# Patient Record
Sex: Male | Born: 1935 | Race: Black or African American | Hispanic: No | Smoking: Former smoker
Health system: Southern US, Community
[De-identification: ages and names within clinical notes are randomized; demographics above are authoritative.]

## PROBLEM LIST (undated history)

## (undated) DIAGNOSIS — I255 Ischemic cardiomyopathy: Secondary | ICD-10-CM

## (undated) DIAGNOSIS — I10 Essential (primary) hypertension: Secondary | ICD-10-CM

## (undated) DIAGNOSIS — I2510 Atherosclerotic heart disease of native coronary artery without angina pectoris: Secondary | ICD-10-CM

## (undated) DIAGNOSIS — E785 Hyperlipidemia, unspecified: Secondary | ICD-10-CM

## (undated) HISTORY — DX: Hyperlipidemia, unspecified: E78.5

---

## 1998-06-02 ENCOUNTER — Ambulatory Visit (HOSPITAL_COMMUNITY): Admission: RE | Admit: 1998-06-02 | Discharge: 1998-06-02 | Payer: Self-pay | Admitting: Gastroenterology

## 2003-07-15 ENCOUNTER — Ambulatory Visit (HOSPITAL_COMMUNITY): Admission: RE | Admit: 2003-07-15 | Discharge: 2003-07-15 | Payer: Self-pay | Admitting: Gastroenterology

## 2008-05-15 ENCOUNTER — Ambulatory Visit: Admission: RE | Admit: 2008-05-15 | Discharge: 2008-08-09 | Payer: Self-pay | Admitting: Radiation Oncology

## 2008-08-09 ENCOUNTER — Ambulatory Visit: Admission: RE | Admit: 2008-08-09 | Discharge: 2008-08-26 | Payer: Self-pay | Admitting: Radiation Oncology

## 2008-08-27 ENCOUNTER — Encounter: Admission: RE | Admit: 2008-08-27 | Discharge: 2008-08-27 | Payer: Self-pay | Admitting: Urology

## 2008-09-09 ENCOUNTER — Ambulatory Visit (HOSPITAL_BASED_OUTPATIENT_CLINIC_OR_DEPARTMENT_OTHER): Admission: RE | Admit: 2008-09-09 | Discharge: 2008-09-09 | Payer: Self-pay | Admitting: Urology

## 2008-09-25 ENCOUNTER — Ambulatory Visit: Admission: RE | Admit: 2008-09-25 | Discharge: 2008-10-21 | Payer: Self-pay | Admitting: Radiation Oncology

## 2011-03-16 NOTE — Op Note (Signed)
NAMECLEMENTS, TORO                ACCOUNT NO.:  [id number]   MEDICAL RECORD NO.:  [id number]          PATIENT TYPE:  AMB   LOCATION:  NESC                         FACILITY:  [facility]   PHYSICIAN:  Lindaann Slough, M.D.  DATE OF BIRTH:  1936-05-16   DATE OF PROCEDURE:  09/09/2008  DATE OF DISCHARGE:                               OPERATIVE REPORT   PREOPERATIVE DIAGNOSIS:  Adenocarcinoma of prostate stage T1C.   POSTOPERATIVE DIAGNOSIS:  Adenocarcinoma of prostate stage T1C.   PROCEDURES:  125 seed implantation and cystoscopy.   SURGEON:  Danae Chen, M.D.   ANESTHESIA:  General.   INDICATIONS:  The patient is a 75 year old male who has prostate cancer,  Gleason score of 3+4 at the left mid gland and 3+3 at the other biopsy  sites.  PSA at diagnosis was 4.4.  He elected to have radiation  treatment.  He was seen by Dr. Dayton Scrape, who advised him to have  combination external beam radiotherapy and brachytherapy.  He has  already received external beam radiotherapy.  He is scheduled today for  brachytherapy.   The patient was identified by his wrist band and proper time-out was  taken.  He was then placed in the dorsal lithotomy position.  Ultrasound  planning was done by Dr. Dayton Scrape and when the planning was completed, a  total of 26 needles were used to insert 74 seeds in the prostate.  The  total apparent activity is 25.4560 mCi.  Then the Foley catheter that  was previously inserted in the bladder was removed.  A flexible  cystoscope was then passed in the bladder.  The anterior urethra is  normal.  He has moderate prostatic hypertrophy.  There is no stone, seed  or tumor in the bladder.  The ureteral orifices are in normal position  and shape with clear efflux.  The cystoscope was then removed.  A #16  Foley catheter was then inserted in the bladder.   Please note that the procedure was done by myself and Dr. Chipper Herb.   The patient tolerated the procedure well and left the  OR in satisfactory  condition to post anesthesia care unit.      Lindaann Slough, M.D.  Electronically Signed     MN/MEDQ  D:  09/09/2008  T:  09/09/2008  Job:  [id number]   cc:   Maryln Gottron, M.D.  Fax: [phone]

## 2011-03-19 NOTE — Op Note (Signed)
   NAME:  Dillon Middleton, DONALD A                          ACCOUNT NO.:  [id number]   MEDICAL RECORD NO.:  [id number]                   PATIENT TYPE:  AMB   LOCATION:  ENDO                                 FACILITY:  [facility]   PHYSICIAN:  Danise Edge, M.D.                DATE OF BIRTH:  [birth year]   DATE OF PROCEDURE:  07/15/2003  DATE OF DISCHARGE:                                 OPERATIVE REPORT   PROCEDURE:  Screening colonoscopy.   REFERRED BY:  Al Decant. Janey Greaser, MD   INDICATIONS FOR PROCEDURE:  Klayton Monie is a 75 year old male born 19-Apr-1936. In 1990, he underwent a colonoscopy with removal of a colon polyp.  His 1999 screening colonoscopy was normal. He is scheduled to undergo his  third screening colonoscopy with polypectomy to prevent colon cancer.   ENDOSCOPIST:  Charolett Bumpers, M.D.   PREMEDICATION:  Versed 5 mg, Demerol 50 mg .   DESCRIPTION OF PROCEDURE:  After obtaining informed consent, Mr. Swander was  placed in the left lateral decubitus position. I administered intravenous  Demerol and intravenous Versed to achieve conscious sedation for the  procedure. The patient's blood pressure, oxygen saturation and cardiac  rhythm were monitored throughout the procedure and documented in the medical  record.   Anal inspection was normal. Digital rectal exam revealed a small nonnodular  prostate. The Olympus pediatric adjustable colonoscope was introduced into  the rectum and advanced to the cecum. A normal appearing ileocecal valve was  intubated and distal ileum inspected. Colonic preparation for the exam today  was excellent.   RECTUM:  Normal.   SIGMOID COLON AND DESCENDING COLON:  Normal.   SPLENIC FLEXURE:  Normal.   TRANSVERSE COLON:  Normal.   HEPATIC FLEXURE:  Normal.   ASCENDING COLON:  Normal.   CECUM AND ILEOCECAL VALVE:  Normal.   DISTAL ILEUM:  Normal.    ASSESSMENT:  Normal screening proctocolonoscopy to the cecum.   RECOMMENDATIONS:   Repeat colonoscopy in five years.                                               Danise Edge, M.D.    MJ/MEDQ  D:  07/15/2003  T:  07/15/2003  Job:  [id number]   cc:   Al Decant. Janey Greaser, MD  [address]  [place]  [place] [address]  Fax: [phone]

## 2011-06-10 ENCOUNTER — Ambulatory Visit: Payer: Self-pay | Admitting: Sports Medicine

## 2011-07-01 ENCOUNTER — Ambulatory Visit (INDEPENDENT_AMBULATORY_CARE_PROVIDER_SITE_OTHER): Payer: Medicare Other | Admitting: Sports Medicine

## 2011-07-01 ENCOUNTER — Encounter: Payer: Self-pay | Admitting: Sports Medicine

## 2011-07-01 VITALS — HR 80 | Ht 71.1 in | Wt 189.0 lb

## 2011-07-01 DIAGNOSIS — M1710 Unilateral primary osteoarthritis, unspecified knee: Secondary | ICD-10-CM

## 2011-07-01 DIAGNOSIS — M25561 Pain in right knee: Secondary | ICD-10-CM

## 2011-07-01 DIAGNOSIS — M1711 Unilateral primary osteoarthritis, right knee: Secondary | ICD-10-CM

## 2011-07-01 DIAGNOSIS — M25569 Pain in unspecified knee: Secondary | ICD-10-CM

## 2011-07-01 NOTE — Progress Notes (Signed)
  Subjective:    Patient ID: Dillon Middleton, male    DOB: 1936-04-22, 75 y.o.   MRN: [id number]  CC: Right knee pain  HPI  Right knee Pain and swelling noted off/on for past 2-3 years Worse in past 3 weeks after doing a workout with burpees with personal trainer Swelling noted after running long distance and playing racketball Crepitus noted with movement No redness, bruising, locking, or giving way  Enjoys cycling, who does not cause knee pain or swelling  Hx of right knee open surgery for osteochondral fragment removal in 1974  Psych counselor at [employer]  Review of Systems  No fever, chills, sweats, weight loss No nausea, vomiting, diarrhea No numbness, tingling No knee pain currently, mild posterior knee swelling, see HPI     Objective:   Physical Exam  No pain currently  Const: NAD, sitting on table Skin: no redness, swelling, bruising, no warmth Neuro: reflexes 2+ patellar and achilles, normal light sensation lower extremities  Musk/skel:  Right knee Good thigh and hamstring tone, 5/5 strength Ext  -3 deg Flex 130 deg Mild laxity ACL and MCL Scar along medial patella with thickened scar tissue Mild fullness in popliteal fossa  Left knee Good thigh and hamstring tone, 5/5 strength Ext  0 deg Flex 160 deg No laxity   Assessment & Plan:

## 2011-07-01 NOTE — Assessment & Plan Note (Signed)
OK to continue OTC aleve as needed for a few days w flares Icing p activity

## 2011-07-01 NOTE — Patient Instructions (Signed)
Aleve 2 tabs by mouth twice a day x 3 days then as needed for pain or swelling Glucosamine/chondroitin supplement daily, see bottle for dose Avoid burpees and deep knee bends Wear knee sleeve for activities with weight bearing and deep knee bends  Return as needed for knee pain or swelling

## 2011-07-01 NOTE — Assessment & Plan Note (Signed)
Consider trial of glucosamine chondroitin  Avoid deg of knee flex that would create injury  Compression to lessen swelling Prn return

## 2011-08-03 LAB — CBC: Hemoglobin: 14.9

## 2011-08-03 LAB — PROTIME-INR: Prothrombin Time: 13.1

## 2011-08-03 LAB — COMPREHENSIVE METABOLIC PANEL
ALT: 25
Alkaline Phosphatase: 68
Chloride: 106
GFR calc Af Amer: >60
GFR calc non Af Amer: >60

## 2012-02-05 ENCOUNTER — Encounter (HOSPITAL_COMMUNITY): Payer: Self-pay | Admitting: *Deleted

## 2012-02-05 ENCOUNTER — Other Ambulatory Visit: Payer: Self-pay

## 2012-02-05 ENCOUNTER — Encounter (HOSPITAL_COMMUNITY)
Admission: EM | Disposition: A | Discharge status: Home / Self Care | Payer: Self-pay | Source: Home / Self Care | Attending: Cardiovascular Disease

## 2012-02-05 ENCOUNTER — Inpatient Hospital Stay (HOSPITAL_COMMUNITY)
Admission: EM | Admit: 2012-02-05 | Discharge: 2012-02-07 | DRG: 247 | Disposition: A | Discharge status: Home / Self Care | Payer: Medicare Other | Attending: Cardiovascular Disease | Admitting: Cardiovascular Disease

## 2012-02-05 DIAGNOSIS — I2109 ST elevation (STEMI) myocardial infarction involving other coronary artery of anterior wall: Secondary | ICD-10-CM

## 2012-02-05 DIAGNOSIS — I2510 Atherosclerotic heart disease of native coronary artery without angina pectoris: Secondary | ICD-10-CM

## 2012-02-05 DIAGNOSIS — I2589 Other forms of chronic ischemic heart disease: Secondary | ICD-10-CM | POA: Diagnosis present

## 2012-02-05 DIAGNOSIS — R079 Chest pain, unspecified: Secondary | ICD-10-CM

## 2012-02-05 DIAGNOSIS — I2582 Chronic total occlusion of coronary artery: Secondary | ICD-10-CM | POA: Diagnosis present

## 2012-02-05 DIAGNOSIS — Z7982 Long term (current) use of aspirin: Secondary | ICD-10-CM

## 2012-02-05 DIAGNOSIS — I213 ST elevation (STEMI) myocardial infarction of unspecified site: Secondary | ICD-10-CM

## 2012-02-05 DIAGNOSIS — Z6826 Body mass index (BMI) 26.0-26.9, adult: Secondary | ICD-10-CM

## 2012-02-05 DIAGNOSIS — Z87891 Personal history of nicotine dependence: Secondary | ICD-10-CM

## 2012-02-05 DIAGNOSIS — I10 Essential (primary) hypertension: Secondary | ICD-10-CM | POA: Diagnosis present

## 2012-02-05 HISTORY — DX: Atherosclerotic heart disease of native coronary artery without angina pectoris: I25.10

## 2012-02-05 HISTORY — DX: Essential (primary) hypertension: I10

## 2012-02-05 HISTORY — PX: PERCUTANEOUS CORONARY STENT INTERVENTION (PCI-S): SHX5485

## 2012-02-05 HISTORY — DX: Ischemic cardiomyopathy: I25.5

## 2012-02-05 HISTORY — PX: LEFT HEART CATH: SHX5478

## 2012-02-05 LAB — CARDIAC PANEL(CRET KIN+CKTOT+MB+TROPI)
CK, MB: 2.1 ng/mL (ref 0.3–4.0)
CK, MB: 188.2 ng/mL (ref 0.3–4.0)
Total CK: 67 U/L (ref 7–232)
Total CK: 4115 U/L — ABNORMAL HIGH (ref 7–232)
Total CK: 3084 U/L — ABNORMAL HIGH (ref 7–232)
Troponin I: >25 ng/mL (ref <0.30)

## 2012-02-05 LAB — POCT I-STAT TROPONIN I: Troponin i, poc: 0.04 ng/mL (ref 0.00–0.08)

## 2012-02-05 LAB — BASIC METABOLIC PANEL
BUN: 13 mg/dL (ref 6–23)
CO2: 29 mEq/L (ref 19–32)
Calcium: 9.1 mg/dL (ref 8.4–10.5)
Creatinine, Ser: 1.08 mg/dL (ref 0.50–1.35)
Potassium: 3.9 mEq/L (ref 3.5–5.1)

## 2012-02-05 LAB — APTT: aPTT: 24 seconds (ref 24–37)

## 2012-02-05 LAB — DIFFERENTIAL
Basophils Absolute: 0 10*3/uL (ref 0.0–0.1)
Basophils Relative: 1 % (ref 0–1)
Eosinophils Relative: 1 % (ref 0–5)
Lymphs Abs: 0.7 10*3/uL (ref 0.7–4.0)
Monocytes Relative: 15 % — ABNORMAL HIGH (ref 3–12)
Neutro Abs: 2.9 10*3/uL (ref 1.7–7.7)
Neutrophils Relative %: 67 % (ref 43–77)

## 2012-02-05 LAB — PROTIME-INR: Prothrombin Time: 12.6 seconds (ref 11.6–15.2)

## 2012-02-05 LAB — CBC
RBC: 4.73 MIL/uL (ref 4.22–5.81)
RDW: 13 % (ref 11.5–15.5)
WBC: 4.3 10*3/uL (ref 4.0–10.5)

## 2012-02-05 LAB — MRSA PCR SCREENING: MRSA by PCR: NEGATIVE

## 2012-02-05 SURGERY — LEFT HEART CATH
Anesthesia: LOCAL | Site: Groin | Laterality: Right

## 2012-02-05 MED ORDER — HEPARIN (PORCINE) IN NACL 100-0.45 UNIT/ML-% IJ SOLN
INTRAMUSCULAR | Status: AC
  Filled 2012-02-05: qty 250

## 2012-02-05 MED ORDER — LISINOPRIL 5 MG PO TABS
5.0000 mg | ORAL_TABLET | Freq: Every day | ORAL | Status: DC
  Administered 2012-02-05: 5 mg via ORAL
  Filled 2012-02-05 (×2): qty 1

## 2012-02-05 MED ORDER — ASPIRIN 81 MG PO CHEW: 324.0000 mg | CHEWABLE_TABLET | ORAL | Status: AC

## 2012-02-05 MED ORDER — OXYCODONE-ACETAMINOPHEN 5-325 MG PO TABS: 1.0000 | ORAL_TABLET | ORAL | Status: DC | PRN

## 2012-02-05 MED ORDER — ASPIRIN 81 MG PO CHEW
81.0000 mg | CHEWABLE_TABLET | Freq: Every day | ORAL | Status: DC
  Administered 2012-02-06: 81 mg via ORAL

## 2012-02-05 MED ORDER — NITROGLYCERIN 0.4 MG SL SUBL
0.4000 mg | SUBLINGUAL_TABLET | SUBLINGUAL | Status: DC | PRN
  Filled 2012-02-05: qty 25

## 2012-02-05 MED ORDER — ASPIRIN 300 MG RE SUPP: 300.0000 mg | RECTAL | Status: AC

## 2012-02-05 MED ORDER — SODIUM CHLORIDE 0.9 % IV SOLN
INTRAVENOUS | Status: AC
  Administered 2012-02-05: 10:00:00 via INTRAVENOUS

## 2012-02-05 MED ORDER — HEPARIN BOLUS VIA INFUSION
4000.0000 [IU] | Freq: Once | INTRAVENOUS | Status: AC
  Administered 2012-02-05: 4000 [IU] via INTRAVENOUS

## 2012-02-05 MED ORDER — ATORVASTATIN CALCIUM 80 MG PO TABS
80.0000 mg | ORAL_TABLET | Freq: Every day | ORAL | Status: DC
  Administered 2012-02-05 – 2012-02-07 (×3): 80 mg via ORAL
  Filled 2012-02-05 (×3): qty 1

## 2012-02-05 MED ORDER — MIDAZOLAM HCL 2 MG/2ML IJ SOLN
INTRAMUSCULAR | Status: AC
  Filled 2012-02-05: qty 2

## 2012-02-05 MED ORDER — ACETAMINOPHEN 325 MG PO TABS: 650.0000 mg | ORAL_TABLET | ORAL | Status: DC | PRN

## 2012-02-05 MED ORDER — ASPIRIN EC 81 MG PO TBEC
81.0000 mg | DELAYED_RELEASE_TABLET | Freq: Every day | ORAL | Status: DC
  Administered 2012-02-06 – 2012-02-07 (×2): 81 mg via ORAL
  Filled 2012-02-05 (×2): qty 1

## 2012-02-05 MED ORDER — ACETAMINOPHEN 325 MG PO TABS
650.0000 mg | ORAL_TABLET | ORAL | Status: DC | PRN
  Filled 2012-02-05: qty 2

## 2012-02-05 MED ORDER — METOPROLOL TARTRATE 25 MG PO TABS: 25.0000 mg | ORAL_TABLET | Freq: Two times a day (BID) | ORAL | Status: DC

## 2012-02-05 MED ORDER — SODIUM CHLORIDE 0.9 % IV SOLN: 0.2500 mg/kg/h | INTRAVENOUS | Status: DC

## 2012-02-05 MED ORDER — LIDOCAINE HCL (PF) 1 % IJ SOLN
INTRAMUSCULAR | Status: AC
  Filled 2012-02-05: qty 30

## 2012-02-05 MED ORDER — ASPIRIN 81 MG PO CHEW
324.0000 mg | CHEWABLE_TABLET | Freq: Once | ORAL | Status: AC
  Administered 2012-02-05: 324 mg via ORAL
  Filled 2012-02-05: qty 4

## 2012-02-05 MED ORDER — FENTANYL CITRATE 0.05 MG/ML IJ SOLN
INTRAMUSCULAR | Status: AC
  Filled 2012-02-05: qty 2

## 2012-02-05 MED ORDER — HEPARIN (PORCINE) IN NACL 2-0.9 UNIT/ML-% IJ SOLN
INTRAMUSCULAR | Status: AC
  Filled 2012-02-05: qty 2000

## 2012-02-05 MED ORDER — TICAGRELOR 90 MG PO TABS
ORAL_TABLET | ORAL | Status: AC
  Administered 2012-02-05: 90 mg via ORAL
  Filled 2012-02-05: qty 2

## 2012-02-05 MED ORDER — HEPARIN SODIUM (PORCINE) 1000 UNIT/ML IJ SOLN
INTRAMUSCULAR | Status: AC
  Filled 2012-02-05: qty 1

## 2012-02-05 MED ORDER — BIVALIRUDIN 250 MG IV SOLR
INTRAVENOUS | Status: AC
  Filled 2012-02-05: qty 250

## 2012-02-05 MED ORDER — ONDANSETRON HCL 4 MG/2ML IJ SOLN: 4.0000 mg | Freq: Four times a day (QID) | INTRAMUSCULAR | Status: DC | PRN

## 2012-02-05 MED ORDER — VERAPAMIL HCL 2.5 MG/ML IV SOLN
INTRAVENOUS | Status: AC
  Filled 2012-02-05: qty 2

## 2012-02-05 MED ORDER — CARVEDILOL 6.25 MG PO TABS
6.2500 mg | ORAL_TABLET | Freq: Two times a day (BID) | ORAL | Status: DC
  Administered 2012-02-05 (×2): 6.25 mg via ORAL
  Filled 2012-02-05 (×5): qty 1

## 2012-02-05 MED ORDER — TICAGRELOR 90 MG PO TABS
90.0000 mg | ORAL_TABLET | Freq: Two times a day (BID) | ORAL | Status: DC
  Administered 2012-02-05 – 2012-02-07 (×4): 90 mg via ORAL
  Filled 2012-02-05 (×5): qty 1

## 2012-02-05 NOTE — CV Procedure (Addendum)
    Cardiac Catheterization Operative Report  HULEN MANDLER [id number] 4/6/20134:51 AM No primary provider on file.  Procedure Performed:  1. Left Heart Catheterization 2. Selective Coronary Angiography 3. Left ventricular angiogram 4. PTCA/DES x 1 mid LAD  Operator: Verne Carrow, MD  Arterial access site:  Right radial artery.   Indication:  Anterior STEMI                                     Procedure Details: Pt transported emergently from [facility] ED. Emergency consent obtained. The risks, benefits, complications, treatment options, and expected outcomes were discussed with the patient. The patient agreed to proceed. The patient was further sedated with Versed. The right wrist was assessed with an Allens test which was positive. The right wrist was prepped and draped in a sterile fashion. 1% lidocaine was used for local anesthesia. Using the modified Seldinger access technique, a 6 French sheath was placed in the right radial artery. 3 mg Verapamil was given through the sheath. An Angiomax drip was started after a bolus was given.  Standard diagnostic catheters were used to perform selective coronary angiography. A XB LAD 3.5 guiding catheter was used to engage the left main artery. When the ACT was greater than 200, I passed a Cougar IC wire down the  LAD. There was reperfusion with the wire crossing the occlusion. A 2.5 x 15 mm balloon was used to pre-dilate the stenosis. A 2.75 x 22 Resolute Integrity DES was deployed in the mid LAD. A 3.0 x 20 mm [place] balloon was used to post-dilate the stent. There was an excellent angiographic result. The stenosis was taken from 100% to 0%. There was excellent flow down the vessel. The guiding catheter and wire were removed. A pigtail catheter was used to perform a left ventricular angiogram. The sheath was removed from the right radial artery and a hemostasis band was applied at the arteriotomy site on the right wrist.   There were no  immediate complications. The patient was taken to the recovery area in stable condition.   Pt arrived in ED at [facility] and first EKG at 3:40am. Code STEMI called at 4:24am. Pt arrived at [facility] in the cath lab at 4:59am. First balloon at 5:23am. Reperfusion at 5:23 am.   Hemodynamic Findings: Central aortic pressure: 121/72 Left ventricular pressure: 119/9/20  Angiographic Findings:  Left main: No obstructive disease noted.   Left Anterior Descending Artery:  Mild plaque proximal vessel. 100% mid occlusion. The diagonal is moderate sized and and has a 40% proximal stenosis.   Circumflex Artery: Large vessel with large OM1 and moderated sized posterolateral branch. No obstructive disease noted.   Right Coronary Artery: Large dominant vessel with no obstructive disease noted.   Left Ventricular Angiogram: LVEF 40-45%. Hypokinesis of the anterior wall and apex.    Impression: 1.  Anterior STEMI secondary to occluded mid LAD 2.  Successful PTCA/DES x 1 mid LAD 3.  Segmental LV dysfunction  Recommendations: He will need ASA/Brilinta for one year. We will start beta blocker and statin. We will add an Ace-inhibitor if his BP tolerates.        Complications:  None. The patient tolerated the procedure well.

## 2012-02-05 NOTE — ED Provider Notes (Signed)
History     CSN: [id number]  Arrival date & time 02/05/12  9629   First MD Initiated Contact with Patient 02/05/12 [phone]      Chief Complaint  Patient presents with  . Chest Pain    (Consider location/radiation/quality/duration/timing/severity/associated sxs/prior treatment) HPI Comments: Patient developed chest pain while riding his bike about 2 weeks ago. Since that time he has had several episodes of substernal chest pain with exercise. Tonight he woke up from sleep and started having heaviness in his chest that didn't resolve quickly. Will on arrival to the ER the chest pain resolved and on exam in the room patient is not currently having chest pain. The pain does not radiate and is always dull in nature.  Patient is a 76 y.o. male presenting with chest pain. The history is provided by the patient.  Chest Pain The chest pain began 1 - 2 weeks ago. Chest pain occurs intermittently. The chest pain is resolved. The pain is associated with exertion. At its most intense, the pain is at 6/10. The pain is currently at 0/10. The severity of the pain is moderate. The quality of the pain is described as aching, dull and pressure-like. The pain does not radiate. Chest pain is worsened by exertion. Primary symptoms include shortness of breath. Pertinent negatives for primary symptoms include no cough, no wheezing, no nausea, no vomiting and no dizziness.  Pertinent negatives for associated symptoms include no diaphoresis and no lower extremity edema. He tried nothing for the symptoms. Risk factors include male gender and being elderly.  His past medical history is significant for hypertension.  Pertinent negatives for past medical history include no CAD, no diabetes and no hyperlipidemia.  Procedure history is negative for cardiac catheterization.     Past Medical History  Diagnosis Date  . Hypertension     History reviewed. No pertinent past surgical history.  History reviewed. No pertinent  family history.  History  Substance Use Topics  . Smoking status: Former Games developer  . Smokeless tobacco: Never Used  . Alcohol Use: No      Review of Systems  Constitutional: Negative for diaphoresis.  Respiratory: Positive for shortness of breath. Negative for cough and wheezing.   Cardiovascular: Positive for chest pain.  Gastrointestinal: Negative for nausea and vomiting.  Neurological: Negative for dizziness.  All other systems reviewed and are negative.    Allergies  Review of patient's allergies indicates no known allergies.  Home Medications   Current Outpatient Rx  Name Route Sig Dispense Refill  . AMLODIPINE BESYLATE 5 MG PO TABS Oral Take 5 mg by mouth daily.    Marland Kitchen LOSARTAN POTASSIUM 100 MG PO TABS Oral Take 100 mg by mouth daily.      BP 151/97  Pulse 70  Temp(Src) 97.8 F (36.6 C) (Oral)  Resp 20  SpO2 100%  Physical Exam  Nursing note and vitals reviewed. Constitutional: He is oriented to person, place, and time. He appears well-developed and well-nourished. No distress.  HENT:  Head: Normocephalic and atraumatic.  Mouth/Throat: Oropharynx is clear and moist.  Eyes: Conjunctivae and EOM are normal. Pupils are equal, round, and reactive to light.  Neck: Normal range of motion. Neck supple.  Cardiovascular: Normal rate, regular rhythm and intact distal pulses.   No murmur heard. Pulmonary/Chest: Effort normal and breath sounds normal. No respiratory distress. He has no wheezes. He has no rales.  Abdominal: Soft. He exhibits no distension. There is no tenderness. There is no rebound and no  guarding.  Musculoskeletal: Normal range of motion. He exhibits no edema and no tenderness.  Neurological: He is alert and oriented to person, place, and time.  Skin: Skin is warm and dry. No rash noted. No erythema.  Psychiatric: He has a normal mood and affect. His behavior is normal.    ED Course  Procedures (including critical care time)   Labs Reviewed  BASIC  METABOLIC PANEL  CBC  DIFFERENTIAL  PROTIME-INR  APTT  CARDIAC PANEL(CRET KIN+CKTOT+MB+TROPI)   No results found.   Date: 02/06/2012  Rate: 70  Rhythm: normal sinus rhythm  QRS Axis: normal  Intervals: normal  ST/T Wave abnormalities: nonspecific ST changes, 1mm elevation of ST segment in V3  Conduction Disutrbances:right bundle branch block  Narrative Interpretation:   Old EKG Reviewed: none available   Date: 02/06/2012  Rate: 65  Rhythm: normal sinus rhythm  QRS Axis: normal  Intervals: normal  ST/T Wave abnormalities: ST elevations anteriorly  more prominent 1 mm ST elevation in V3 and V4. No reciprocal changes noted.  Conduction Disutrbances:right bundle branch block  Narrative Interpretation:   Old EKG Reviewed: changes noted, patient with more suspicious ST elevation in V3 and V4 from prior EKG.      1. STEMI (ST elevation myocardial infarction)       MDM   Initial EKG done at 340 and showed 1 concerning lead in V3 with 1 mm of ST elevation. EKG repeated at 406 and now more concerning for possible acute MI however not all the criteria was met for STEMI. Interventional cardiologist contacted Dr. Vernetta Honey and he reviewed the EKG and agreed to call a code STEMI.  This was discussed with the patient. [facility] came and transported the patient to [facility].        Gwyneth Sprout, MD 02/06/12 [phone]

## 2012-02-05 NOTE — H&P (Signed)
Dillon Middleton, Dillon Middleton                ACCOUNT NO.:  [id number]  MEDICAL RECORD NO.:  [id number]  LOCATION:  2907                         FACILITY:  [facility]  PHYSICIAN:  Natasha Bence, MD       DATE OF BIRTH:  Jul 25, 1936  DATE OF ADMISSION:  02/05/2012 DATE OF DISCHARGE:                             HISTORY & PHYSICAL   CHIEF COMPLAINT:  Chest pain.  HISTORY OF PRESENT ILLNESS:  Dillon Middleton is a 76 year old, black male, who presents to the emergency department with substernal chest heaviness as he describes it.  He reports this pain abruptly woke him up at 1:30 a.m. He was also mildly short of breath and diaphoretic.  He has not had this type of chest discomfort before.  He is an avid bike rider and rides approximately 20 mile a day.  He does this without any angina or shortness of breath.  Otherwise, he has no history of palpitations, PND, or orthopnea.  He has no planned surgeries.  He denies any history of bleeding or melena.  Otherwise, no fevers, chills, or sweats.  REVIEW OF SYSTEMS:  A 12-point review of system was otherwise negative.  PAST MEDICAL HISTORY:  Hypertension.  FAMILY HISTORY:  No known coronary artery disease.  SOCIAL HISTORY:  He is a former smoker, quit over 50 years ago, just very short history of smoking.  No alcohol or illicit drug use.  ALLERGIES:  He has no known allergies.  MEDICATIONS:  He takes amlodipine 5 mg p.o. daily and losartan 100 mg p.o. daily.  PHYSICAL EXAMINATION:  VITAL SIGNS:  He is afebrile with a temperature of 97.8, pulse of 70 and regular, blood pressure of 151/97, O2 sats 100%, respiratory rate 20. GENERAL:  He is a well-developed, well-nourished black male in no apparent distress.  HEENT:  Eyes, anicteric sclerae.  Mucous membranes are moist. NECK:  He has normal jugular venous pressure.  No carotid bruits. LUNGS:  Clear to auscultation bilaterally. CARDIOVASCULAR:  Regular rate and rhythm.  No murmurs, rubs, or gallops. ABDOMEN:   Soft, nontender, nondistended. EXTREMITIES:  Warm with no edema.  He has symmetrical pulses throughout. NEURO:  Grossly afocal.  LABORATORY DATA:  Sodium 140, potassium 3.9, chloride of 105, bicarb of 29, BUN of 13, creatinine of 1.08, glucose of 112.  White blood cell count of 4.3, hematocrit of 41, platelet count of 133.  Troponin of 0.04.  EKG shows anterior ST elevation.  IMPRESSION AND PLAN:  This is a 76 year old, black male, with hypertension presents with anterior ST-elevation myocardial infarction. He was taken promptly to the catheterization lab where he was found to have an occluded mid left anterior descending.  This was successfully stented with a drug-eluting stent.  He is currently pain free and hemodynamically stable.  We will admit him to the ICU on telemetry, obtain an echocardiogram in the morning.  Otherwise, we will place him on dual anti-platelet therapy with aspirin and Brilinta.  He will also be on high-dose statin and beta blocker.          ______________________________ Natasha Bence, MD     MH/MEDQ  D:  02/05/2012  T:  02/05/2012  Job:  [id number] 

## 2012-02-05 NOTE — Progress Notes (Signed)
CRITICAL VALUE ALERT  Critical value received:  Ckmb 186.2, trop>25.0  Date of notification:  02/05/2012   Time of notification:  1:45 PM   Critical value read back:yes  Nurse who received alert:  Desiree Lucy    MD notified (1st page):  Barrett pa 1:46 PM   Time of first page:  1:55 PM   MD notified (2nd page):  Time of second page:  Responding MD:     Time MD responded:

## 2012-02-05 NOTE — Progress Notes (Addendum)
CARDIAC REHAB PHASE I   PRE:  Rate/Rhythm: 68 SR w/ PVCs  BP:  Supine: 144/90 Sitting:   Standing:    SaO2: 99% RA  MODE:  Ambulation: 700 ft   POST:  Rate/Rhythem: 73 SR  BP:  Supine: 149/100 rck BP 144/96   Sitting:   Standing:    SaO2: 100% RA  1350-1445 Pt tolerated ambulation very well without c/o. Gait steady. BP was elevated before and after ambulation, informed pt's RN about elevated BP. Reviewed MI/PCI/stent education with patient and pt's family including, restrictions, risk factors, CP, NTG use, & calling 911, heart healthy diet and exercise guidelines given. Pt follows a pretty healthy diet and is a regular exerciser. Pt voices understanding of instructions given. Reviewed Phase 2 Cardiac Rehab program with pt and permission given to send his contact info to CR program at [facility]  Annetta Maw

## 2012-02-05 NOTE — Progress Notes (Signed)
Chaplain's Note:  Chaplain Lumpkin asked me to follow-up with pt this morning.  I visited with the patient and offered emotional and spiritual support.  Pt was very grateful for Chaplain Lumpkin's support last night and mine this morning.   Please page if further assistance is needed. Dellie Catholic  [phone]  Oncall pager  02/05/12 [phone]  Clinical Encounter Type  Visited With Patient  Visit Type Follow-up  Referral From Chaplain  Spiritual Encounters  Spiritual Needs Emotional  Stress Factors  Patient Stress Factors Major life changes

## 2012-02-05 NOTE — Progress Notes (Addendum)
Spiritual Care and Wholeness:  Dillon Middleton came to [facility] from [facility]. He had driven himself first to an [facility], then to [facility] with chest pains. His automobile is in the parking lot at [facility] and at release he will need a shuttle to [facility] to retrieve it.  No family or friends came to be with the pt, a niece may come later. His "girlfriend" is in [place] and may come during the weekend. His mother, who is over 3 years of age is in a local elder care facility. Her mental state is such that she likely would not understand if she was told of her son's hospitalization so no attempt will be made to alert her at the Pt request. I had time after the procedure to speak with the pt and based on this conversation I found  Dillon Middleton is at peace with what has been done for him. He has a positive attitude and seems ready to follow physician's instructions for preventative and post procedure care.  Dillon Middleton chief concern is "why me" because he thought he was doing everything "right" to keep good health. He is not a smoker, keeps his weight down to avoid diabetes and eats right. He felt like his mother he would live healthy long after his 89th birthday.   At the time of this note no room assignment has been completed. I will pass on to incoming chaplains to follow up during daylight hours after the pt has had time to rest.   Dillon Middleton, APC, D.Min Chaplain 6:40 AM  02/05/2012

## 2012-02-05 NOTE — ED Notes (Signed)
PT states chest heaviness w/ exertion starting x 2 weeks ago which recurred w/ exercise since then. Pt states tonight when he woke from sleep to use the bathroom, he started having this sensation of heaviness again and it has not resolved. Pt states nosebleed x 3 days ago which resolved.

## 2012-02-05 NOTE — Progress Notes (Signed)
Report from Night RN. Chart reviewed together. Handoff complete.  

## 2012-02-06 DIAGNOSIS — I219 Acute myocardial infarction, unspecified: Secondary | ICD-10-CM

## 2012-02-06 DIAGNOSIS — I517 Cardiomegaly: Secondary | ICD-10-CM

## 2012-02-06 LAB — CBC
HCT: 40.2 % (ref 39.0–52.0)
Hemoglobin: 14 g/dL (ref 13.0–17.0)
MCV: 85.4 fL (ref 78.0–100.0)
WBC: 5.2 10*3/uL (ref 4.0–10.5)

## 2012-02-06 LAB — BASIC METABOLIC PANEL
BUN: 8 mg/dL (ref 6–23)
CO2: 27 mEq/L (ref 19–32)
Chloride: 104 mEq/L (ref 96–112)
Creatinine, Ser: 1.01 mg/dL (ref 0.50–1.35)
Glucose, Bld: 115 mg/dL — ABNORMAL HIGH (ref 70–99)
Potassium: 3.8 mEq/L (ref 3.5–5.1)

## 2012-02-06 MED ORDER — LISINOPRIL 5 MG PO TABS: 5.0000 mg | ORAL_TABLET | Freq: Two times a day (BID) | ORAL | Status: DC

## 2012-02-06 MED ORDER — LOSARTAN POTASSIUM 50 MG PO TABS
50.0000 mg | ORAL_TABLET | Freq: Two times a day (BID) | ORAL | Status: DC
  Administered 2012-02-06 – 2012-02-07 (×2): 50 mg via ORAL
  Filled 2012-02-06 (×4): qty 1

## 2012-02-06 MED ORDER — CARVEDILOL 6.25 MG PO TABS
9.3750 mg | ORAL_TABLET | Freq: Two times a day (BID) | ORAL | Status: DC
  Administered 2012-02-06: 9.375 mg via ORAL
  Filled 2012-02-06 (×6): qty 1

## 2012-02-06 NOTE — Progress Notes (Signed)
  Echocardiogram 2D Echocardiogram has been performed.  Kiran Lapine, Real Cons 02/06/2012, 3:21 PM

## 2012-02-06 NOTE — Progress Notes (Signed)
Patient ID: Dillon Middleton, male   DOB: 1936-02-21, 76 y.o.   MRN: [id number]    SUBJECTIVE: No chest pain or dyspnea.  Feels well.     . aspirin  324 mg Oral NOW   Or  . aspirin  300 mg Rectal NOW  . aspirin  81 mg Oral Daily  . aspirin EC  81 mg Oral Daily  . atorvastatin  80 mg Oral q1800  . carvedilol  9.375 mg Oral BID WC  . heparin      . losartan  50 mg Oral BID  . Ticagrelor  90 mg Oral BID  . DISCONTD: carvedilol  6.25 mg Oral BID WC  . DISCONTD: lisinopril  5 mg Oral Daily  . DISCONTD: lisinopril  5 mg Oral BID  . DISCONTD: metoprolol tartrate  25 mg Oral BID      Filed Vitals:   02/05/12 2100 02/05/12 2300 02/06/12 0345 02/06/12 0736  BP: 141/95 127/83 126/88   Pulse:      Temp:  98.6 F (37 C) 98 F (36.7 C) 98.6 F (37 C)  TempSrc:  Oral Oral Oral  Resp:  16 16 16   Height:      Weight:      SpO2:  99% 99% 97%    Intake/Output Summary (Last 24 hours) at 02/06/12 0800 Last data filed at 02/06/12 0600  Gross per 24 hour  Intake    120 ml  Output   1350 ml  Net  -1230 ml    LABS: Basic Metabolic Panel:  Basename 02/06/12 0502 02/05/12 0430  NA 139 140  K 3.8 3.9  CL 104 105  CO2 27 29  GLUCOSE 115* 112*  BUN 8 13  CREATININE 1.01 1.08  CALCIUM 9.1 9.1  MG -- --  PHOS -- --   Liver Function Tests: No results found for this basename: AST:2,ALT:2,ALKPHOS:2,BILITOT:2,PROT:2,ALBUMIN:2 in the last 72 hours No results found for this basename: LIPASE:2,AMYLASE:2 in the last 72 hours CBC:  Basename 02/06/12 0502 02/05/12 0430  WBC 5.2 4.3  NEUTROABS -- 2.9  HGB 14.0 14.0  HCT 40.2 40.6  MCV 85.4 85.8  PLT 136* 133*   Cardiac Enzymes:  Basename 02/05/12 1747 02/05/12 1200 02/05/12 0430  CKTOTAL 3084* 4115* 67  CKMB 115.7* 188.2* 2.1  CKMBINDEX -- -- --  TROPONINI >25.00* >25.00* <0.30    RADIOLOGY: No results found.  PHYSICAL EXAM General: NAD Neck: No JVD, no thyromegaly or thyroid nodule.  Lungs: Clear to auscultation  bilaterally with normal respiratory effort. CV: Nondisplaced PMI.  Heart regular S1/S2, no S3/S4, no murmur.  No peripheral edema.  No carotid bruit.  Normal pedal pulses.  Abdomen: Soft, nontender, no hepatosplenomegaly, no distention.  Neurologic: Alert and oriented x 3.  Psych: Normal affect. Extremities: No clubbing or cyanosis.   TELEMETRY: Reviewed telemetry pt in NSR  ECG: Anterior MI, RBBB, LAFB  ASSESSMENT AND PLAN:  76 yo with history of HTN presented with anterior STEMI, now s/p PCI to LAD.  1. CAD: Anterior STEMI with DES to LAD.  Continue ASA 81, ticagrelor, high dose statin.  2. Ischemic cardiomyopathy: ECG suggestive of extensive anterior MI.  Euvolemic on exam.  EF 40-45% on LV-gram.   - Echo today: If EF < 40%, would start spironolactone and also strongly consider Lifevest.  - Increase Coreg to 9.375 mg bid. - Has had cough with lisinopril, will stop and start losartan 50 mg bid.  3. Transfer to telemetry.   Norvell Ureste [employer]  02/06/2012 8:04 AM

## 2012-02-07 ENCOUNTER — Other Ambulatory Visit: Payer: Self-pay

## 2012-02-07 ENCOUNTER — Encounter (HOSPITAL_COMMUNITY): Payer: Self-pay | Admitting: Physician Assistant

## 2012-02-07 DIAGNOSIS — I255 Ischemic cardiomyopathy: Secondary | ICD-10-CM | POA: Insufficient documentation

## 2012-02-07 DIAGNOSIS — I2510 Atherosclerotic heart disease of native coronary artery without angina pectoris: Secondary | ICD-10-CM | POA: Insufficient documentation

## 2012-02-07 LAB — BASIC METABOLIC PANEL
BUN: 16 mg/dL (ref 6–23)
CO2: 26 mEq/L (ref 19–32)
Chloride: 103 mEq/L (ref 96–112)
GFR calc non Af Amer: 66 mL/min — ABNORMAL LOW (ref >90)
Glucose, Bld: 102 mg/dL — ABNORMAL HIGH (ref 70–99)
Potassium: 3.9 mEq/L (ref 3.5–5.1)
Sodium: 138 mEq/L (ref 135–145)

## 2012-02-07 LAB — LIPID PANEL: Cholesterol: 155 mg/dL (ref 0–200)

## 2012-02-07 MED ORDER — TICAGRELOR 90 MG PO TABS: 90.0000 mg | ORAL_TABLET | Freq: Two times a day (BID) | ORAL | Status: DC

## 2012-02-07 MED ORDER — LOSARTAN POTASSIUM 50 MG PO TABS: 50.0000 mg | ORAL_TABLET | Freq: Two times a day (BID) | ORAL | Status: DC

## 2012-02-07 MED ORDER — NITROGLYCERIN 0.4 MG SL SUBL: 0.4000 mg | SUBLINGUAL_TABLET | SUBLINGUAL | Status: DC | PRN

## 2012-02-07 MED ORDER — CARVEDILOL 3.125 MG PO TABS
3.1250 mg | ORAL_TABLET | Freq: Two times a day (BID) | ORAL | Status: DC
  Administered 2012-02-07: 3.125 mg via ORAL
  Filled 2012-02-07 (×2): qty 1

## 2012-02-07 MED ORDER — ATORVASTATIN CALCIUM 80 MG PO TABS: 80.0000 mg | ORAL_TABLET | Freq: Every day | ORAL | Status: DC

## 2012-02-07 MED ORDER — ASPIRIN 81 MG PO TABS: 81.0000 mg | ORAL_TABLET | Freq: Every day | ORAL | Status: AC

## 2012-02-07 MED ORDER — CARVEDILOL 3.125 MG PO TABS
3.1250 mg | ORAL_TABLET | Freq: Once | ORAL | Status: AC
  Administered 2012-02-07: 3.125 mg via ORAL

## 2012-02-07 MED ORDER — CARVEDILOL 3.125 MG PO TABS: 3.1250 mg | ORAL_TABLET | Freq: Two times a day (BID) | ORAL | Status: DC

## 2012-02-07 MED FILL — Dextrose Inj 5%: INTRAVENOUS | Qty: 50 | Status: AC

## 2012-02-07 NOTE — Progress Notes (Signed)
SUBJECTIVE: NO chest pain or SOB. No events.   BP 97/68  Pulse 62  Temp(Src) 98.3 F (36.8 C) (Oral)  Resp 16  Ht 5\' 11"  (1.803 m)  Wt 189 lb 9.5 oz (86 kg)  BMI 26.44 kg/m2  SpO2 98%  Intake/Output Summary (Last 24 hours) at 02/07/12 0648 Last data filed at 02/06/12 1000  Gross per 24 hour  Intake    240 ml  Output    400 ml  Net   -160 ml    PHYSICAL EXAM General: Well developed, well nourished, in no acute distress. Alert and oriented x 3.  Psych:  Good affect, responds appropriately Neck: No JVD. No masses noted.  Lungs: Clear bilaterally with no wheezes or rhonci noted.  Heart: RRR with no murmurs noted. Abdomen: Bowel sounds are present. Soft, non-tender.  Extremities: No lower extremity edema.   LABS: Basic Metabolic Panel:  Basename 02/07/12 0033 02/06/12 0502  NA 138 139  K 3.9 3.8  CL 103 104  CO2 26 27  GLUCOSE 102* 115*  BUN 16 8  CREATININE 1.08 1.01  CALCIUM 9.0 9.1  MG -- --  PHOS -- --   CBC:  Basename 02/06/12 0502 02/05/12 0430  WBC 5.2 4.3  NEUTROABS -- 2.9  HGB 14.0 14.0  HCT 40.2 40.6  MCV 85.4 85.8  PLT 136* 133*   Cardiac Enzymes:  Basename 02/05/12 1747 02/05/12 1200 02/05/12 0430  CKTOTAL 3084* 4115* 67  CKMB 115.7* 188.2* 2.1  CKMBINDEX -- -- --  TROPONINI >25.00* >25.00* <0.30   Fasting Lipid Panel:  Basename 02/07/12 0033  CHOL 155  HDL 66  LDLCALC 77  TRIG 61  CHOLHDL 2.3  LDLDIRECT --    Current Meds:    . aspirin  81 mg Oral Daily  . aspirin EC  81 mg Oral Daily  . atorvastatin  80 mg Oral q1800  . carvedilol  9.375 mg Oral BID WC  . losartan  50 mg Oral BID  . Ticagrelor  90 mg Oral BID  . DISCONTD: carvedilol  6.25 mg Oral BID WC  . DISCONTD: lisinopril  5 mg Oral Daily  . DISCONTD: lisinopril  5 mg Oral BID     Echo 02/06/12: Left ventricle: The cavity size was normal. Wall thickness was increased in a pattern of moderate LVH. Systolic function was moderately to severely reduced. The  estimated ejection fraction was in the range of 30% to 35%. Mid to apical anteroseptal akinesis, apical anterior/apical lateral/apical inferior akinesis, akinesis of the true apex. No LV apical thrombus. Doppler parameters are consistent with abnormal left ventricular relaxation (grade 1 diastolic dysfunction). - Aortic valve: There was no stenosis. - Mitral valve: No significant regurgitation. - Left atrium: The atrium was mildly dilated. - Right ventricle: The cavity size was normal. Systolic function was normal. - Right atrium: The atrium was mildly dilated. - Pulmonary arteries: No complete TR doppler jet so unable to estimate PA systolic pressure. - Inferior vena cava: The vessel was normal in size; the respirophasic diameter changes were in the normal range (= 50%); findings are consistent with normal central venous pressure.  Assessment and Plan: 76 yo with history of HTN presented with anterior STEMI, now s/p PCI to LAD. Echo with LVEF of 35%.   1. CAD: Anterior STEMI with DES to LAD. Continue ASA 81, Brilinta, high dose statin, beta blocker. He is now on an ARB. Will lower Coreg to 3.125 mg po BID with bradycardia and hypotension.  2. Ischemic cardiomyopathy: ECG suggestive of extensive anterior MI. Euvolemic on exam. EF 30-35% on echo. He is agreeable to have a Lifevest at discharge given his high risk of sudden cardiac death.  Continue Coreg and Losartan. He will need repeat echo in 6 weeks and if LVEF is still less than 35% , he will need an ICD.   3. Dispo: D/C home today. F/U with me in 2 weeks. No cycling for next week. No work for next week.     Dillon Middleton  4/8/20136:48 AM

## 2012-02-07 NOTE — Discharge Instructions (Signed)
Myocardial Infarction A myocardial infarction (MI) is damage to the heart that is not reversible. It is also called a heart attack. An MI usually occurs when a heart (coronary) artery becomes blocked or narrowed. This cuts off the blood supply to the heart. When one or more of the heart (coronary) arteries becomes blocked, that area of the heart begins to die. This causes pain felt during an MI.  If you think you might be having an MI, call your local emergency services immediately (911 in [place] It is recommended that you take a 162 mg non-enteric coated aspirin if you do not have an aspirin allergy. Do not drive yourself to the hospital or wait to see if your symptoms go away. The sooner MI is treated, the greater the amount of heart muscle saved. Time is muscle. It can save your life. CAUSES  An MI can occur from:  A gradual buildup of a fatty substance called plaque. When plaque builds up in the arteries, this condition is called atherosclerosis. This buildup can block or reduce the blood supply to the heart artery(s).   A sudden plaque rupture within a heart artery that causes a blood clot (thrombus). A blood clot can block the heart artery which does not allow blood flow to the heart.   A severe tightening (spasm) of the heart artery. This is a less common cause of a heart attack. When a heart artery spasms, it cuts off blood flow through the artery. Spasms can occur in heart arteries that do not have atherosclerosis.  RISK FACTORS People at risk for an MI usually have one or more risk factors, such as:  High blood pressure.   High cholesterol.   Smoking.   Gender. Men have a higher heart attack risk.   Overweight/obesity.   Age.   Family history.   Lack of exercise.   Diabetes.   Stress.   Excessive alcohol use.   Street drug use (cocaine and methamphetamines).  SYMPTOMS  MI symptoms can vary, such as:  In both men and women, MI symptoms can include the following:    Chest pain. The chest pain may feel like a crushing, squeezing, or "pressure" type feeling. MI pain can be "referred," meaning pain can be caused in one part of the body but felt in another part of the body. Referred MI pain may occur in the left arm, neck, or jaw. Pain may even be felt in the right arm.   Shortness of breath (dyspnea).   Heartburn or indigestion with or without vomiting, shortness of breath, or sweating (diaphoresis).   Sudden, cold sweats.   Sudden lightheadedness.   Upper back pain.   Women can have unique MI symptoms, such as:   Unexplained feelings of nervousness or anxiety.   Discomfort between the shoulder blades (scapula) or upper back.   Tingling in the hands and arms.   In elderly people (regardless of gender), MI symptoms can be subtle, such as:   Sweating (diaphoresis).   Shortness of breath (dyspnea).   General tiredness (fatigue) or not feeling well (malaise).  DIAGNOSIS  Diagnosis of an MI involves several tests such as:  An assessment of your vital signs such as heart rhythm, blood pressure, respiratory rate, and oxygen level.   An EKG (ECG) to look at the electrical activity of your heart.   Blood tests called cardiac markers are drawn at scheduled times to measure proteins or enzymes released by the damaged heart muscle.   A chest   X-ray.   An echocardiogram to evaluate heart motion and blood flow.   Coronary angiography (cardiac catheterization). This is a diagnostic procedure to look at the heart arteries.  TREATMENT  Acute Intervention. For an MI, the national standard in the [place] is to have an acute intervention in under 90 minutes from the time you get to the hospital. An acute intervention is a special procedure to open up the heart arteries. It is done in a treatment room called a "catheterization lab" (cath lab). Some hospitals do no have a cath lab. If you are having an MI and the hospital does not have a cath lab, the  standard is to transport you to a hospital that has one. In the cath lab, acute intervention includes:  Angioplasty. An angioplasty involves inserting a thin, flexible tube (catheter) into an artery in either your groin or wrist. The catheter is threaded to the heart arteries. A balloon at the end of the catheter is inflated to open a narrowed or blocked heart artery. During an angioplasty procedure, a small mesh tube (stent) may be used to keep the heart artery open. Depending on your condition and health history, one of two types of stents may be placed:   Drug-eluting stent (DES). A DES is coated with a medicine to prevent scar tissue from growing over the stent. With drug-eluting stents, blood thinning medicine will need to be taken for up to a year.   Bare metal stent. This type of stent has no special coating to keep tissue from growing over it. This type of stent is used if you cannot take blood thinning medicine for a prolonged time or you need surgery in the near future. After a bare metal stent is placed, blood thinning medicine will need to be taken for about a month.   If you are taking blood thinning medicine (anti-platelet therapy) after stent placement, do not stop taking it unless your caregiver says it is okay to do so. Make sure you understand how long you need to take the medicine.  Surgical Intervention  If an acute intervention is not successful, surgery may be needed:   Open heart surgery (coronary artery bypass graft, CABG). CABG takes a vein (saphenous vein) from your leg. The vein is then attached to the blocked heart artery which bypasses the blockage. This then allows blood flow to the heart muscle.  Additional Interventions  A "clot buster" medicine (thrombolytic) may be given. This medicine can help break up a clot in the heart artery. This medicine may be given if a person cannot get to a cath lab right away.   Intra-aortic balloon pump (IABP). If you have suffered a  very severe MI and are too unstable to go to the cath lab or to surgery, an IABP may be used. This is a temporary mechanical device used to increase blood flow to the heart and reduce the workload of the heart until you are stable enough to go to the cath lab or surgery.  HOME CARE INSTRUCTIONS After an MI, you may need the following:  Medication. Take medication as directed by your caregiver. Medications after an MI may:   Keep your blood from clotting easily (blood thinners).   Control your blood pressure.   Help lower your cholesterol.   Control abnormal heart rhythms.   Lifestyle changes. Under the guidance of your caregiver, lifestyle changes include:   Quitting smoking, if you smoke. Your caregiver can help you quit.   Being   physically active.   Maintaining a healthy weight.   Eating a heart healthy diet. A dietician can help you learn healthy eating options.   Managing diabetes.   Reducing stress.   Limiting alcohol intake.  SEEK IMMEDIATE MEDICAL CARE IF:   You have severe chest pain, especially if the pain is crushing or pressure-like and spreads to the arms, back, neck, or jaw. This is an emergency. Do not wait to see if the pain will go away. Get medical help at once. Call your local emergency services (911 in the [place] Do not drive yourself to the hospital.   You have shortness of breath during rest, sleep, or with activity.   You have sudden sweating or clammy skin.   You feel sick to your stomach (nauseous) and throw up (vomit).   You suddenly become lightheaded or dizzy.   You feel your heart beating rapidly or you notice "skipped" beats.  MAKE SURE YOU:   Understand these instructions.   Will watch your condition.   Will get help right away if you are not doing well or get worse.  Document Released: 10/18/2005 Document Revised: 10/07/2011 Document Reviewed: 03/17/2011 [facility] Patient Information 2012 [place], [place].    Carvedilol tablets What  is this medicine? CARVEDILOL (KAR ve dil ol) is a beta-blocker. Beta-blockers reduce the workload on the heart and help it to beat more regularly. This medicine is used to treat high blood pressure and heart failure. This medicine may be used for other purposes; ask your health care provider or pharmacist if you have questions. What should I tell my health care provider before I take this medicine? They need to know if you have any of these conditions: -circulation problems -diabetes -history of heart attack or heart disease -liver disease -lung or breathing disease, like asthma or emphysema -pheochromocytoma -slow or irregular heartbeat -thyroid disease -an unusual or allergic reaction to carvedilol, other beta-blockers, medicines, foods, dyes, or preservatives -pregnant or trying to get pregnant -breast-feeding How should I use this medicine? Take this medicine by mouth with a glass of water. Follow the directions on the prescription label. It is best to take the tablets with food. Take your doses at regular intervals. Do not take your medicine more often than directed. Do not stop taking except on the advice of your doctor or health care professional. Talk to your pediatrician regarding the use of this medicine in children. Special care may be needed. Overdosage: If you think you have taken too much of this medicine contact a poison control center or emergency room at once. NOTE: This medicine is only for you. Do not share this medicine with others. What if I miss a dose? If you miss a dose, take it as soon as you can. If it is almost time for your next dose, take only that dose. Do not take double or extra doses. What may interact with this medicine? Do not take this medicine with any of the following medications: -sotalol This medicine may also interact with the following medications: -cimetidine -clonidine -cyclosporine -digoxin -MAOIs like Carbex, Eldepryl, Marplan, Nardil, and  Parnate -medicines for blood pressure, heart disease, irregular heart beat -medicines for depression like fluoxetine and paroxetine -medicines for diabetes -medicines to control heart rhythm like propafenone and quinidine -reserpine -rifampin This list may not describe all possible interactions. Give your health care provider a list of all the medicines, herbs, non-prescription drugs, or dietary supplements you use. Also tell them if you smoke, drink alcohol, or  use illegal drugs. Some items may interact with your medicine. What should I watch for while using this medicine? Check your heart rate and blood pressure regularly while you are taking this medicine. Ask your doctor or health care professional what your heart rate and blood pressure should be, and when you should contact him or her. Do not stop taking this medicine suddenly. This could lead to serious heart-related effects. Contact your doctor or health care professional if you have difficulty breathing while taking this drug. Check your weight daily. Ask your doctor or health care professional when you should notify him/her of any weight gain. You may get drowsy or dizzy. Do not drive, use machinery, or do anything that requires mental alertness until you know how this medicine affects you. To reduce the risk of dizzy or fainting spells, do not sit or stand up quickly. Alcohol can make you more drowsy, and increase flushing and rapid heartbeats. Avoid alcoholic drinks. If you have diabetes, check your blood sugar as directed. Tell your doctor if you have changes in your blood sugar while you are taking this medicine. If you are going to have surgery, tell your doctor or health care professional that you are taking this medicine. What side effects may I notice from receiving this medicine? Side effects that you should report to your doctor or health care professional as soon as possible: -allergic reactions like skin rash, itching or hives,  swelling of the face, lips, or tongue -breathing problems -dark urine -irregular heartbeat -swollen legs or ankles -vomiting -yellowing of the eyes or skin Side effects that usually do not require medical attention (report to your doctor or health care professional if they continue or are bothersome): -change in sex drive or performance -diarrhea -dry eyes (especially if wearing contact lenses) -dry, itching skin -headache -nausea -unusually tired This list may not describe all possible side effects. Call your doctor for medical advice about side effects. You may report side effects to FDA at 1-800-FDA-1088. Where should I keep my medicine? Keep out of the reach of children. Store at room temperature below 30 degrees C (86 degrees F). Protect from moisture. Keep container tightly closed. Throw away any unused medicine after the expiration date. NOTE: This sheet is a summary. It may not cover all possible information. If you have questions about this medicine, talk to your doctor, pharmacist, or health care provider.  2012, [employer]. (01/11/2008 2:39:22 PM)   Cardiac Diet This diet can help prevent heart disease and stroke. Many factors influence your heart health, including eating and exercise habits. Coronary risk rises a lot with abnormal blood fat (lipid) levels. Cardiac meal planning includes limiting unhealthy fats, increasing healthy fats, and making other small dietary changes. General guidelines are as follows:  Adjust calorie intake to reach and maintain desirable body weight.   Limit total fat intake to less than 30% of total calories. Saturated fat should be less than 7% of calories.   Saturated fats are found in animal products and in some vegetable products. Saturated vegetable fats are found in coconut oil, cocoa butter, palm oil, and palm kernel oil. Read labels carefully to avoid these products as much as possible. Use butter in moderation. Choose tub  margarines and oils that have 2 grams of fat or less. Good cooking oils are canola and olive oils.   Practice low-fat cooking techniques. Do not fry food. Instead, broil, bake, boil, steam, grill, roast on a rack, stir-fry, or microwave it. Other fat  reducing suggestions include:   Remove the skin from poultry.   Remove all visible fat from meats.   Skim the fat off stews, soups, and gravies before serving them.   Steam vegetables in water or broth instead of sauting them in fat.   Avoid foods with trans fat (or hydrogenated oils), such as commercially fried foods and commercially baked goods. Commercial shortening and deep-frying fats will contain trans fat.   Increase intake of fruits, vegetables, whole grains, and legumes to replace foods high in fat.   Increase consumption of nuts, legumes, and seeds to at least 4 servings weekly. One serving of a legume equals  cup, and 1 serving of nuts or seeds equals  cup.   Choose whole grains more often. Have 3 servings per day (a serving is 1 ounce [oz]).   Have at least 4 cups of fruit and vegetable a day.   Increase your intake of soluble fiber to 10 to 25 grams per day. Soluble fiber binds cholesterol to be removed from the blood. Foods high in soluble fiber are dried beans, citrus fruits, oats, apples, bananas, broccoli, Brussels sprouts, and eggplant.   Try to include foods fortified with plant sterols or stanols, such as yogurt, breads, juices, or margarines. Choose several fortified foods to achieve a daily intake of 2 to 3 grams of plant sterols or stanols.   Foods with omega-3 fats can help reduce your risk of heart disease. Aim to have a 3.5 oz portion of fatty fish twice per week, such as salmon, mackerel, albacore tuna, sardines, lake trout, or herring. If you wish to take a fish oil supplement, choose one that contains 1 gram of both DHA and EPA.   Limit processed meats to 2 servings (3 oz portion) weekly.   Limit the sodium  in your diet to 1500 milligrams (mg) per day. If you have high blood pressure, talk to a registered dietitian about a DASH (Dietary Approaches to Stop Hypertension) eating plan.   Limit beverages with added sugar, such as soda, to no more than 36 ounces per week.  CHOOSING FOODS Starches  Allowed: Breads: All kinds (wheat, rye, raisin, white, oatmeal, [place], [place], and English muffin bread). Low-fat rolls: English muffins, frankfurter and hamburger buns, bagels, pita bread, tortillas (not fried). Pancakes, waffles, biscuits, and muffins made with recommended oil.   Avoid: Products made with saturated or trans fats, oils, or whole milk products. Butter rolls, cheese breads, croissants. Commercial doughnuts, muffins, sweet rolls, biscuits, waffles, pancakes, store-bought mixes.  Crackers  Allowed: Low-fat crackers and snacks: Animal, graham, rye, saltine (with recommended oil, no lard), oyster, and matzo crackers. Bread sticks, melba toast, rusks, flatbread, pretzels, and light popcorn.   Avoid: High-fat crackers: cheese crackers, butter crackers, and those made with coconut, palm oil, or trans fat (hydrogenated oils). Buttered popcorn.  Cereals  Allowed: Hot or cold whole-grain cereals.   Avoid: Cereals containing coconut, hydrogenated vegetable fat, or animal fat.  Potatoes / Pasta / Rice  Allowed: All kinds of potatoes, rice, and pasta (such as macaroni, spaghetti, and noodles).   Avoid: Pasta or rice prepared with cream sauce or high-fat cheese. Chow mein noodles, [place] fries.  Vegetables  Allowed: All vegetables and vegetable juices.   Avoid: Fried vegetables. Vegetables in cream, butter, or high-fat cheese sauces. Limit coconut. Fruit in cream or custard.  Meat and Meat Substitutes  Allowed: Limit your intake of meat, seafood, and poultry to no more than 6 oz (cooked weight) per day.  All lean, well-trimmed beef, veal, pork, and lamb. All chicken and [place] without skin. All  fish and shellfish. Wild game: wild duck, rabbit, pheasant, and venison. Meatless dishes: recipes with dried beans, peas, lentils, and tofu (soybean curd). Seeds and nuts: all seeds and most nuts.   Avoid: Prime grade and other heavily marbled and fatty meats, such as short ribs, spare ribs, rib eye roast or steak, frankfurters, sausage, bacon, and high-fat luncheon meats, mutton. Caviar. Commercially fried fish. Domestic duck, goose, venison sausage. Organ meats: liver, gizzard, heart, chitterlings, brains, kidney, sweetbreads.  Dairy  Allowed: Egg whites or low-cholesterol egg substitutes may be used as desired. Low-fat cheeses: nonfat or low-fat cottage cheese (1% or 2% fat), cheeses made with part skim milk, such as mozzarella, farmers, string, or ricotta. (Cheeses should be labeled no more than 2 to 6 grams fat per oz.)   Avoid: Whole milk cheeses, including colby, cheddar, [place], [address], [place], [place], [place], [address], Swiss, and blue. Creamed cottage cheese, cream cheese.  Milk  Allowed: Skim (or 1%) milk: liquid, powdered, or evaporated. Buttermilk made with low-fat milk. Drinks made with skim or low-fat milk or cocoa. Chocolate milk or cocoa made with skim or low-fat (1%) milk. Nonfat or low-fat yogurt.   Avoid: Whole milk and whole milk products, including buttermilk or yogurt made from whole milk, drinks made from whole milk. Condensed milk, evaporated whole milk, and 2% milk.  Soups and Combination Foods  Allowed: Low-fat low-sodium soups: broth, dehydrated soups, homemade broth, soups with the fat removed, homemade cream soups made with skim or low-fat milk. Low-fat spaghetti, lasagna, chili, and Spanish rice if low-fat ingredients and low-fat cooking techniques are used.   Avoid: Cream soups made with whole milk, cream, or high-fat cheese. All other soups.  Desserts and Sweets  Allowed: Sherbet, fruit ices, gelatins, meringues, and angel food cake. Homemade desserts  with recommended fats, oils, and milk products. Jam, jelly, honey, marmalade, sugars, and syrups. Pure sugar candy, such as gum drops, hard candy, jelly beans, marshmallows, mints, and small amounts of dark chocolate.   Avoid: Commercially prepared cakes, pies, cookies, frosting, pudding, or mixes for these products. Desserts containing whole milk products, chocolate, coconut, lard, palm oil, or palm kernel oil. Ice cream or ice cream drinks. Candy that contains chocolate, coconut, butter, hydrogenated fat, or unknown ingredients. Buttered syrups.  Fats and Oils  Allowed: Vegetable oils: safflower, sunflower, corn, soybean, cottonseed, sesame, canola, olive, or peanut. Non-hydrogenated margarines. Salad dressing or mayonnaise: homemade or commercial, made with a recommended oil. Low or nonfat salad dressing or mayonnaise.   Limit added fats and oils to 6 to 8 tsp per day (includes fats used in cooking, baking, salads, and spreads on bread). Remember to count the "hidden fats" in foods.   Avoid: Solid fats and shortenings: butter, lard, salt pork, bacon drippings. Gravy containing meat fat, shortening, or suet. Cocoa butter, coconut. Coconut oil, palm oil, palm kernel oil, or hydrogenated oils: these ingredients are often used in bakery products, nondairy creamers, whipped toppings, candy, and commercially fried foods. Read labels carefully. Salad dressings made of unknown oils, sour cream, or cheese, such as blue cheese and Roquefort. Cream, all kinds: half-and-half, light, heavy, or whipping. Sour cream or cream cheese (even if "light" or low-fat). Nondairy cream substitutes: coffee creamers and sour cream substitutes made with palm, palm kernel, hydrogenated oils, or coconut oil.  Beverages  Allowed: Coffee (regular or decaffeinated), tea. diet carbonated beverages, mineral water. Alcohol: Check with your  caregiver. Moderation is recommended.   Avoid: Whole milk, regular sodas, and juice drinks with  added sugar.  Condiments  Allowed: All seasonings and condiments. Cocoa powder. "Cream" sauces made with recommended ingredients.   Avoid: Carob powder made with hydrogenated fats.  SAMPLE MENU Breakfast   cup orange juice    cup oatmeal   1 slice toast   1 tsp margarine   1 cup skim milk  Lunch  [place] sandwich with 2 oz [place], 2 slices bread   Lettuce and tomato slices   Fresh fruit   Carrot sticks   Coffee or tea   Snack   Fresh fruit or low-fat crackers  Dinner  3 oz lean ground beef   1 baked potato   1 tsp margarine    cup asparagus   Lettuce salad   1 tbs non-creamy dressing    cup peach slices   1 cup skim milk  Document Released: 07/27/2008 Document Revised: 10/07/2011 Document Reviewed: 10/20/2010 [facility] Patient Information 2012 [place], [place].

## 2012-02-07 NOTE — Discharge Summary (Signed)
Discharge Summary   Patient ID: Dillon Middleton MRN: [id number], DOB/AGE: [birth year] 76 y.o. Admit date: 02/05/2012 D/C date:     02/07/2012   Primary Discharge Diagnoses:  1. Newly diagnosed CAD with anterior STEMI PTCA/DES x 1 mid LAD 2. Ischemic cardiomyopathy with EF 30-35% by echo 02/06/12 - s/p Lifevest placement  Secondary Discharge Diagnoses:  1. HTN  Hospital Course: 76 y/o very active M with hx of HTN but no prior cardiac hx presented to [facility] with substernal chest heaviness. The pain woke him up abruptly at 1:30 am with associated SOB and diaphoresis. He is an avid bike rider and had no prior chest discomfort. EKG demonstrated anterior ST elevation and he was taken emergently to the cath lab by Dr. Clifton James demonstrating occluded mid LAD to which he had successful PTCA/DES. LVEF was 40-45% with hypokinesis of anterior wall and apex by cath. He was started on ASA, brilinta, BB and statin. He progressed well post-procedurally. 2D echo was obtained demonstrating persistently low EF of 30-35%. The patient was felt to be euvolemic on exam. Medications were initially uptitrated then adjusted based on borderline bradycardia and hypotension. It was felt he would benefit from Lifevest placement at discharge given high risk of sudden cardiac death. He will need repeat echo in 6 weeks and if EF still <35%, he will need an ICD placed. Dr. Clifton James feels he should not cycle or work for the next week. I discussed his discharge plans with my colleagues given lifevest placement and ultimately he is not to return to full cycling until cleared by Dr. Clifton James (there is a chance with Lifevest his HR may increase and he may be inappropriately shocked). The patient was seen and examined today and felt stable for discharge by Dr. Clifton James pending Lifevest placement today.  Discharge Vitals: Blood pressure 108/62, pulse 62, temperature 98.3 F (36.8 C), temperature source Oral, resp. rate 16, height 5\' 11"  (1.803 m),  weight 189 lb 9.5 oz (86 kg), SpO2 98.00%.  Labs: Lab Results  Component Value Date   WBC 5.2 02/06/2012   HGB 14.0 02/06/2012   HCT 40.2 02/06/2012   MCV 85.4 02/06/2012   PLT 136* 02/06/2012     Lab 02/07/12 0033  NA 138  K 3.9  CL 103  CO2 26  BUN 16  CREATININE 1.08  CALCIUM 9.0  PROT --  BILITOT --  ALKPHOS --  ALT --  AST --  GLUCOSE 102*    Basename 02/05/12 1747 02/05/12 1200 02/05/12 0430  CKTOTAL 3084* 4115* 67  CKMB 115.7* 188.2* 2.1  TROPONINI >25.00* >25.00* <0.30   Lab Results  Component Value Date   CHOL 155 02/07/2012   HDL 66 02/07/2012   LDLCALC 77 02/07/2012   TRIG 61 02/07/2012     Diagnostic Studies/Procedures   1. 2D echo 02/06/12  Study Conclusions - Left ventricle: The cavity size was normal. Wall thickness was increased in a pattern of moderate LVH. Systolic function was moderately to severely reduced. The estimated ejection fraction was in the range of 30% to 35%. Mid to apical anteroseptal akinesis, apical anterior/apical lateral/apical inferior akinesis, akinesis of the true apex. No LV apical thrombus. Doppler parameters are consistent with abnormal left ventricular relaxation (grade 1 diastolic dysfunction). - Aortic valve: There was no stenosis. - Mitral valve: No significant regurgitation. - Left atrium: The atrium was mildly dilated. - Right ventricle: The cavity size was normal. Systolic function was normal. - Right atrium: The atrium was mildly dilated. -  Pulmonary arteries: No complete TR doppler jet so unable to estimate PA systolic pressure. - Inferior vena cava: The vessel was normal in size; the respirophasic diameter changes were in the normal range (= 50%); findings are consistent with normal central venouspressure. Impressions: - Normal LV size with moderate LV hypertrophy. EF 30-35% with wall motion abnormalities consistent with LAD-territory infarction (see above description). Normal RV size and systolic function.  2. Cardiac  catheterization this admission, please see full report and above for summary.  Discharge Medications   Medication List  As of 02/07/2012  1:39 PM   STOP taking these medications         amLODipine 5 MG tablet         TAKE these medications         aspirin 81 MG tablet   Take 1 tablet (81 mg total) by mouth daily.      atorvastatin 80 MG tablet   Commonly known as: LIPITOR   Take 1 tablet (80 mg total) by mouth at bedtime.      carvedilol 3.125 MG tablet   Commonly known as: COREG   Take 1 tablet (3.125 mg total) by mouth 2 (two) times daily with a meal.      losartan 50 MG tablet   Commonly known as: COZAAR   Take 1 tablet (50 mg total) by mouth 2 (two) times daily.      nitroGLYCERIN 0.4 MG SL tablet   Commonly known as: NITROSTAT   Place 1 tablet (0.4 mg total) under the tongue every 5 (five) minutes x 3 doses as needed for chest pain.      Ticagrelor 90 MG Tabs tablet   Commonly known as: BRILINTA   Take 1 tablet (90 mg total) by mouth 2 (two) times daily.            Disposition   The patient will be discharged in stable condition to home. Discharge Orders    Future Appointments: Provider: Department: Dept Phone: Center:   02/22/2012 3:15 PM Kathleene Hazel, MD [facility] [phone] [facility]   03/13/2012 11:30 AM [facility]  None     Future Orders Please Complete By Expires   Diet - low sodium heart healthy      Increase activity slowly      Comments:   No driving for 2 weeks. No heavy lifting for 4 weeks. No sexual activity for 4 weeks. Do not return to cycling until you are cleared by Dr. Clifton James at your follow-up appointment. You may return to work 02/21/12. Keep procedure site clean & dry. If you notice increased pain, swelling, bleeding or pus, call/return!  You may shower, but no soaking baths/hot tubs/pools for 1 week.        Follow-up Information    Follow up with [facility], [place]. (02/22/12 at  3:15pm)    Contact information:    [facility] [address] [place] [address] [phone]       Follow up with [facility]. (Echocardiogram (heart ultrasound) on 03/13/12 at 11:30am)    Contact information:   [address] [place] [address]            Duration of Discharge Encounter: Greater than 30 minutes including physician and PA time.  Signed, Ronie Spies PA-C 02/07/2012, 1:39 PM

## 2012-02-07 NOTE — Progress Notes (Signed)
Cardiac Rehab [phone] Reviewed discharge education with pt. Answered his questions about Outpt. CRP. Got life vest video on for him to view.

## 2012-02-08 NOTE — Discharge Summary (Signed)
See full note.cdm 

## 2012-02-22 ENCOUNTER — Ambulatory Visit (INDEPENDENT_AMBULATORY_CARE_PROVIDER_SITE_OTHER): Payer: Medicare Other | Admitting: Cardiovascular Disease

## 2012-02-22 ENCOUNTER — Encounter: Payer: Self-pay | Admitting: Cardiovascular Disease

## 2012-02-22 VITALS — BP 148/94 | HR 65 | Ht 74.0 in | Wt 190.0 lb

## 2012-02-22 DIAGNOSIS — I255 Ischemic cardiomyopathy: Secondary | ICD-10-CM

## 2012-02-22 DIAGNOSIS — I2510 Atherosclerotic heart disease of native coronary artery without angina pectoris: Secondary | ICD-10-CM

## 2012-02-22 DIAGNOSIS — I2589 Other forms of chronic ischemic heart disease: Secondary | ICD-10-CM

## 2012-02-22 NOTE — Patient Instructions (Signed)
Your physician recommends that you schedule a follow-up appointment in: 5 weeks.   Your physician has requested that you have an echocardiogram. Echocardiography is a painless test that uses sound waves to create images of your heart. It provides your doctor with information about the size and shape of your heart and how well your heart's chambers and valves are working. This procedure takes approximately one hour. There are no restrictions for this procedure. Scheduled for Mar 13, 2012. Please move to week of Mar 21, 2012

## 2012-02-22 NOTE — Assessment & Plan Note (Signed)
LVEF is 30-35%. Repeat echo in 4 weeks which will be 6 weeks post revascularization. If LVEF less than 35%, will need ICD. Continue Lifevest until his EF is reevaluated. Continue current therapy.

## 2012-02-22 NOTE — Progress Notes (Signed)
History of Present Illness: 76 yo AAM with history of HTN, recent anterior MI 02/05/12 with occluded LAD now s/p DES x 1 LAD, ischemic cardiomyopathy with LVEF of 30-35% by echo who is here today for cardiac follow up. He was admitted 02/05/12 with chest pain, ST elevation c/w anterior MI. Emergent cath with occluded mid LAD. His other vessels were free of obstructive disease. I placed a DES in the mid LAD. He did well but based on his systolic dysfunction, he was discharged home with a LifeVest for prevention of sudden cardiac death due to arrythmias.   He is doing great. No chest pain or SOB. No palpitations. Energy level is normal.   Primary Care Physician: Gildardo Cranker  Last Lipid Profile:  Lipid Panel     Component Value Date/Time   CHOL 155 02/07/2012 0033   TRIG 61 02/07/2012 0033   HDL 66 02/07/2012 0033   CHOLHDL 2.3 02/07/2012 0033   VLDL 12 02/07/2012 0033   LDLCALC 77 02/07/2012 0033     Past Medical History  Diagnosis Date  . Hypertension   . CAD (coronary artery disease)     anterior STEMI PTCA/DES x 1 mid LAD 01/2012  . Ischemic cardiomyopathy     EF 30-35% by echo 02/06/12 s/p LifeVest placement    No past surgical history on file.  Current Outpatient Prescriptions  Medication Sig Dispense Refill  . aspirin 81 MG tablet Take 1 tablet (81 mg total) by mouth daily.      Marland Kitchen atorvastatin (LIPITOR) 80 MG tablet Take 1 tablet (80 mg total) by mouth at bedtime.  30 tablet  6  . carvedilol (COREG) 3.125 MG tablet Take 1 tablet (3.125 mg total) by mouth 2 (two) times daily with a meal.  60 tablet  6  . losartan (COZAAR) 50 MG tablet Take 1 tablet (50 mg total) by mouth 2 (two) times daily.  60 tablet  6  . nitroGLYCERIN (NITROSTAT) 0.4 MG SL tablet Place 1 tablet (0.4 mg total) under the tongue every 5 (five) minutes x 3 doses as needed for chest pain.  25 tablet  4  . Ticagrelor (BRILINTA) 90 MG TABS tablet Take 1 tablet (90 mg total) by mouth 2 (two) times daily.  60 tablet  6     Allergies  Allergen Reactions  . Lisinopril Cough    History   Social History  . Marital Status: Divorced    Spouse Name: N/A    Number of Children: N/A  . Years of Education: N/A   Occupational History  . Not on file.   Social History Main Topics  . Smoking status: Former Games developer  . Smokeless tobacco: Never Used  . Alcohol Use: No  . Drug Use: No  . Sexually Active: Yes    Birth Control/ Protection: None   Other Topics Concern  . Not on file   Social History Narrative  . No narrative on file    No family history on file.  Review of Systems:  As stated in the HPI and otherwise negative.   BP 148/94  Pulse 74  Ht 6\' 2"  (1.88 m)  Wt 190 lb (86.183 kg)  BMI 24.39 kg/m2  Physical Examination: General: Well developed, well nourished, NAD HEENT: OP clear, mucus membranes moist SKIN: warm, dry. No rashes. Neuro: No focal deficits Musculoskeletal: Muscle strength 5/5 all ext Psychiatric: Mood and affect normal Neck: No JVD, no carotid bruits, no thyromegaly, no lymphadenopathy. Lungs:Clear bilaterally, no wheezes, rhonci, crackles  Cardiovascular: Regular rate and rhythm. No murmurs, gallops or rubs. Abdomen:Soft. Bowel sounds present. Non-tender.  Extremities: No lower extremity edema. Pulses are 2 + in the bilateral DP/PT.  EKG: NSR, rate 65 bpm. RBBB. Septal infarct. Flattened Twaves diffusely.   Cardiac cath 02/05/12:  Left main: No obstructive disease noted.  Left Anterior Descending Artery: Mild plaque proximal vessel. 100% mid occlusion. The diagonal is moderate sized and and has a 40% proximal stenosis.  Circumflex Artery: Large vessel with large OM1 and moderated sized posterolateral branch. No obstructive disease noted.  Right Coronary Artery: Large dominant vessel with no obstructive disease noted.  Left Ventricular Angiogram: LVEF 40-45%. Hypokinesis of the anterior wall and apex.  Impression:  1. Anterior STEMI secondary to occluded mid LAD  2.  Successful PTCA/DES x 1 mid LAD  3. Segmental LV dysfunction  Echo 02/06/12: Left ventricle: The cavity size was normal. Wall thickness was increased in a pattern of moderate LVH. Systolic function was moderately to severely reduced. The estimated ejection fraction was in the range of 30% to 35%. Mid to apical anteroseptal akinesis, apical anterior/apical lateral/apical inferior akinesis, akinesis of the true apex. No LV apical thrombus. Doppler parameters are consistent with abnormal left ventricular relaxation (grade 1 diastolic dysfunction). - Aortic valve: There was no stenosis. - Mitral valve: No significant regurgitation. - Left atrium: The atrium was mildly dilated. - Right ventricle: The cavity size was normal. Systolic function was normal. - Right atrium: The atrium was mildly dilated. - Pulmonary arteries: No complete TR doppler jet so unable to estimate PA systolic pressure. - Inferior vena cava: The vessel was normal in size; the respirophasic diameter changes were in the normal range (= 50%); findings are consistent with normal central venous pressure. Impressions:  - Normal LV size with moderate LV hypertrophy. EF 30-35% with wall motion abnormalities consistent with LAD-territory infarction (see above description). Normal RV size and systolic function.

## 2012-02-22 NOTE — Assessment & Plan Note (Addendum)
Stable post acute anterior STEMI. S/p drug eluting stent x 1 mid LAD. He will continue ASA and Brilinta for one year. Will continue ARB and beta blocker, statin.

## 2012-02-24 ENCOUNTER — Ambulatory Visit (HOSPITAL_COMMUNITY): Payer: Medicare Other

## 2012-02-24 ENCOUNTER — Other Ambulatory Visit: Payer: Self-pay | Admitting: *Deleted

## 2012-02-24 DIAGNOSIS — I2589 Other forms of chronic ischemic heart disease: Secondary | ICD-10-CM

## 2012-02-28 ENCOUNTER — Ambulatory Visit (HOSPITAL_COMMUNITY): Payer: Medicare Other

## 2012-03-01 ENCOUNTER — Ambulatory Visit (HOSPITAL_COMMUNITY): Payer: Medicare Other

## 2012-03-03 ENCOUNTER — Ambulatory Visit (HOSPITAL_COMMUNITY): Payer: Medicare Other

## 2012-03-06 ENCOUNTER — Ambulatory Visit (HOSPITAL_COMMUNITY): Payer: Medicare Other

## 2012-03-08 ENCOUNTER — Ambulatory Visit (HOSPITAL_COMMUNITY): Payer: Medicare Other

## 2012-03-10 ENCOUNTER — Ambulatory Visit (HOSPITAL_COMMUNITY): Payer: Medicare Other

## 2012-03-13 ENCOUNTER — Ambulatory Visit (HOSPITAL_COMMUNITY): Payer: Medicare Other

## 2012-03-13 ENCOUNTER — Other Ambulatory Visit (HOSPITAL_COMMUNITY): Payer: Medicare Other

## 2012-03-15 ENCOUNTER — Ambulatory Visit (HOSPITAL_COMMUNITY): Payer: Medicare Other

## 2012-03-17 ENCOUNTER — Ambulatory Visit (HOSPITAL_COMMUNITY): Payer: Medicare Other

## 2012-03-20 ENCOUNTER — Ambulatory Visit (HOSPITAL_COMMUNITY): Payer: Medicare Other

## 2012-03-21 ENCOUNTER — Ambulatory Visit (HOSPITAL_COMMUNITY): Payer: Medicare Other | Attending: Cardiology

## 2012-03-21 ENCOUNTER — Other Ambulatory Visit: Payer: Self-pay

## 2012-03-21 DIAGNOSIS — I379 Nonrheumatic pulmonary valve disorder, unspecified: Secondary | ICD-10-CM | POA: Insufficient documentation

## 2012-03-21 DIAGNOSIS — R079 Chest pain, unspecified: Secondary | ICD-10-CM | POA: Insufficient documentation

## 2012-03-21 DIAGNOSIS — I10 Essential (primary) hypertension: Secondary | ICD-10-CM | POA: Insufficient documentation

## 2012-03-21 DIAGNOSIS — I2589 Other forms of chronic ischemic heart disease: Secondary | ICD-10-CM | POA: Insufficient documentation

## 2012-03-21 DIAGNOSIS — I059 Rheumatic mitral valve disease, unspecified: Secondary | ICD-10-CM | POA: Insufficient documentation

## 2012-03-21 DIAGNOSIS — I079 Rheumatic tricuspid valve disease, unspecified: Secondary | ICD-10-CM | POA: Insufficient documentation

## 2012-03-22 ENCOUNTER — Ambulatory Visit (HOSPITAL_COMMUNITY): Payer: Medicare Other

## 2012-03-24 ENCOUNTER — Ambulatory Visit (HOSPITAL_COMMUNITY): Payer: Medicare Other

## 2012-03-27 ENCOUNTER — Ambulatory Visit (HOSPITAL_COMMUNITY): Payer: Medicare Other

## 2012-03-28 ENCOUNTER — Encounter: Payer: Self-pay | Admitting: Cardiovascular Disease

## 2012-03-28 ENCOUNTER — Ambulatory Visit (INDEPENDENT_AMBULATORY_CARE_PROVIDER_SITE_OTHER): Payer: Medicare Other | Admitting: Cardiovascular Disease

## 2012-03-28 VITALS — BP 122/78 | HR 66 | Ht 71.0 in | Wt 187.0 lb

## 2012-03-28 DIAGNOSIS — I2589 Other forms of chronic ischemic heart disease: Secondary | ICD-10-CM

## 2012-03-28 DIAGNOSIS — I255 Ischemic cardiomyopathy: Secondary | ICD-10-CM

## 2012-03-28 DIAGNOSIS — I2510 Atherosclerotic heart disease of native coronary artery without angina pectoris: Secondary | ICD-10-CM

## 2012-03-28 NOTE — Assessment & Plan Note (Addendum)
Stable. He is tolerating all of his medications well. No chest pain. Will continue ASA/Brilinta/statin. Check lipids at next visit with LFTs. Continue beta blocker.

## 2012-03-28 NOTE — Assessment & Plan Note (Signed)
LVEF 40%. No indication for ICD. Will continue medical management with ARB, beta blocker. His volume status is good.

## 2012-03-28 NOTE — Patient Instructions (Signed)
Your physician wants you to follow-up in:  6 months. You will receive a reminder letter in the mail two months in advance. If you don't receive a letter, please call our office to schedule the follow-up appointment.   

## 2012-03-28 NOTE — Progress Notes (Signed)
History of Present Illness: 76 yo AAM with history of HTN, recent anterior MI 02/05/12 with occluded LAD now s/p DES x 1 LAD, ischemic cardiomyopathy with LVEF of 30-35% by echo April 2013 who is here today for cardiac follow up. He was admitted 02/05/12 with chest pain, ST elevation c/w anterior MI. Emergent cath with occluded mid LAD. His other vessels were free of obstructive disease. I placed a DES in the mid LAD. He did well but based on his systolic dysfunction, he was discharged home with a LifeVest for prevention of sudden cardiac death due to arrythmias. I saw him last in clinic 02/22/12 and he was doing well. I repeated his echo on 03/21/12 and his LV systolic function had improved to 40%. His Lifevest was discontinued.   He is here today for follow up. He is doing great. No chest pain or SOB. No palpitations. Energy level is normal. He is riding his bike again and doing well. HR only 120s at peak exercise on beta blocker.    Primary Care Physician: Gildardo Cranker  Last Lipid Profile:  Lipid Panel     Component Value Date/Time   CHOL 155 02/07/2012 0033   TRIG 61 02/07/2012 0033   HDL 66 02/07/2012 0033   CHOLHDL 2.3 02/07/2012 0033   VLDL 12 02/07/2012 0033   LDLCALC 77 02/07/2012 0033     Past Medical History  Diagnosis Date  . Hypertension   . CAD (coronary artery disease)     anterior STEMI PTCA/DES x 1 mid LAD 01/2012  . Ischemic cardiomyopathy     EF 30-35% by echo 02/06/12 s/p LifeVest placement    No past surgical history on file.  Current Outpatient Prescriptions  Medication Sig Dispense Refill  . aspirin 81 MG tablet Take 1 tablet (81 mg total) by mouth daily.      Marland Kitchen atorvastatin (LIPITOR) 80 MG tablet Take 1 tablet (80 mg total) by mouth at bedtime.  30 tablet  6  . carvedilol (COREG) 3.125 MG tablet Take 1 tablet (3.125 mg total) by mouth 2 (two) times daily with a meal.  60 tablet  6  . losartan (COZAAR) 50 MG tablet Take 1 tablet (50 mg total) by mouth 2 (two) times daily.   60 tablet  6  . nitroGLYCERIN (NITROSTAT) 0.4 MG SL tablet Place 1 tablet (0.4 mg total) under the tongue every 5 (five) minutes x 3 doses as needed for chest pain.  25 tablet  4  . Ticagrelor (BRILINTA) 90 MG TABS tablet Take 1 tablet (90 mg total) by mouth 2 (two) times daily.  60 tablet  6    Allergies  Allergen Reactions  . Lisinopril Cough    History   Social History  . Marital Status: Divorced    Spouse Name: N/A    Number of Children: N/A  . Years of Education: N/A   Occupational History  . Not on file.   Social History Main Topics  . Smoking status: Former Games developer  . Smokeless tobacco: Never Used  . Alcohol Use: No  . Drug Use: No  . Sexually Active: Yes    Birth Control/ Protection: None   Other Topics Concern  . Not on file   Social History Narrative  . No narrative on file    No family history on file.  Review of Systems:  As stated in the HPI and otherwise negative.   BP 122/78  Pulse 66  Ht 5\' 11"  (1.803 m)  Wt  187 lb (84.823 kg)  BMI 26.08 kg/m2  Physical Examination: General: Well developed, well nourished, NAD HEENT: OP clear, mucus membranes moist SKIN: warm, dry. No rashes. Neuro: No focal deficits Musculoskeletal: Muscle strength 5/5 all ext Psychiatric: Mood and affect normal Neck: No JVD, no carotid bruits, no thyromegaly, no lymphadenopathy. Lungs:Clear bilaterally, no wheezes, rhonci, crackles Cardiovascular: Regular rate and rhythm. No murmurs, gallops or rubs. Abdomen:Soft. Bowel sounds present. Non-tender.  Extremities: No lower extremity edema. Pulses are 2 + in the bilateral DP/PT.  Echo 03/21/12:  Left ventricle: The cavity size was normal. Wall thickness was increased in a pattern of mild LVH. Systolic function was moderately reduced. The estimated ejection fraction was in the range of 35% to 40%. Hypokinesis of the anterolateral myocardium. Hypokinesis of the inferoseptal myocardium. Akinesis of the distalanteroseptal and  lateral myocardium. Doppler parameters are consistent with abnormal left ventricular relaxation (grade 1 diastolic dysfunction). - Left atrium: The atrium was mildly dilated. - Atrial septum: No defect or patent foramen ovale was identified. - Pulmonary arteries: PA peak pressure: 37mm Hg (S).

## 2012-03-29 ENCOUNTER — Ambulatory Visit (HOSPITAL_COMMUNITY): Payer: Medicare Other

## 2012-03-31 ENCOUNTER — Ambulatory Visit (HOSPITAL_COMMUNITY): Payer: Medicare Other

## 2012-04-03 ENCOUNTER — Ambulatory Visit (HOSPITAL_COMMUNITY): Payer: Medicare Other

## 2012-04-03 ENCOUNTER — Telehealth: Payer: Self-pay | Admitting: Cardiovascular Disease

## 2012-04-03 NOTE — Telephone Encounter (Signed)
New Problem:    Patient called wanting to ask you if there were any samples of Ticagrelor (BRILINTA) 90 MG TABS tablet available for him to have. Please call back.

## 2012-04-03 NOTE — Telephone Encounter (Signed)
Message left on pt's identified voicemail that we have no samples at this time but are expecting later this week and I would call him when received in office

## 2012-04-04 NOTE — Telephone Encounter (Signed)
Message left for pt that Brilinta samples would be at desk for him to pick up.  Brilinta 90 mg ,-24 tablets,  Lot [id number], exp 05/01/14 left at front desk

## 2012-04-05 ENCOUNTER — Ambulatory Visit (HOSPITAL_COMMUNITY): Payer: Medicare Other

## 2012-04-07 ENCOUNTER — Ambulatory Visit (HOSPITAL_COMMUNITY): Payer: Medicare Other

## 2012-04-10 ENCOUNTER — Ambulatory Visit (HOSPITAL_COMMUNITY): Payer: Medicare Other

## 2012-04-12 ENCOUNTER — Ambulatory Visit (HOSPITAL_COMMUNITY): Payer: Medicare Other

## 2012-04-14 ENCOUNTER — Ambulatory Visit (HOSPITAL_COMMUNITY): Payer: Medicare Other

## 2012-04-17 ENCOUNTER — Ambulatory Visit (HOSPITAL_COMMUNITY): Payer: Medicare Other

## 2012-04-19 ENCOUNTER — Ambulatory Visit (HOSPITAL_COMMUNITY): Payer: Medicare Other

## 2012-04-21 ENCOUNTER — Ambulatory Visit (HOSPITAL_COMMUNITY): Payer: Medicare Other

## 2012-04-24 ENCOUNTER — Ambulatory Visit (HOSPITAL_COMMUNITY): Payer: Medicare Other

## 2012-04-26 ENCOUNTER — Telehealth: Payer: Self-pay | Admitting: Cardiovascular Disease

## 2012-04-26 ENCOUNTER — Ambulatory Visit (HOSPITAL_COMMUNITY): Payer: Medicare Other

## 2012-04-26 ENCOUNTER — Encounter: Payer: Self-pay | Admitting: Nurse Practitioner

## 2012-04-26 ENCOUNTER — Ambulatory Visit (INDEPENDENT_AMBULATORY_CARE_PROVIDER_SITE_OTHER): Payer: Medicare Other | Admitting: Nurse Practitioner

## 2012-04-26 VITALS — BP 146/95 | HR 69 | Ht 71.0 in | Wt 190.0 lb

## 2012-04-26 DIAGNOSIS — R42 Dizziness and giddiness: Secondary | ICD-10-CM | POA: Insufficient documentation

## 2012-04-26 DIAGNOSIS — I2589 Other forms of chronic ischemic heart disease: Secondary | ICD-10-CM

## 2012-04-26 DIAGNOSIS — E785 Hyperlipidemia, unspecified: Secondary | ICD-10-CM | POA: Insufficient documentation

## 2012-04-26 DIAGNOSIS — I2510 Atherosclerotic heart disease of native coronary artery without angina pectoris: Secondary | ICD-10-CM

## 2012-04-26 DIAGNOSIS — I255 Ischemic cardiomyopathy: Secondary | ICD-10-CM

## 2012-04-26 DIAGNOSIS — I10 Essential (primary) hypertension: Secondary | ICD-10-CM | POA: Insufficient documentation

## 2012-04-26 NOTE — Patient Instructions (Addendum)
Keep appointment with Dr. Clifton James.

## 2012-04-26 NOTE — Telephone Encounter (Signed)
Dr.Stuckey recommended for pt to be seen tomorrow or today if we have openings on PA/NP schedule. Patient feels fine now he has an appointment with Christain Sacramento NP the 3:00 PM pt will arrive sometime after 2:00 PM Erlene Senters NP aware.

## 2012-04-26 NOTE — Telephone Encounter (Signed)
Please return call to patient at [phone] regarding low blood pressure.

## 2012-04-26 NOTE — Progress Notes (Signed)
Patient Name: Dillon Middleton Date of Encounter: 04/26/2012  Primary Care Provider:  Daisy Floro, MD Primary Cardiologist:  C. Clifton James, MD  Patient Profile  76 y/o male with h/o ant MI s/p LAD stenting and ICM, who presents 2/2 a dizzy spell this AM.  Problem List   Past Medical History  Diagnosis Date  . Hypertension   . CAD (coronary artery disease)     a. 01/2012 anterior STEMI PTCA/DES x 1 mid LAD  . Ischemic cardiomyopathy     a. 02/06/12 Echo: EF 30-35% s/p LifeVest placement;   b. 03/21/2012 Echo: EF 35-40%, antlat, infsept HK, dist antersept and lat AK, Gr1 DD, lifevest was d/c'd  . Hyperlipidemia    No past surgical history on file.  Allergies  Allergies  Allergen Reactions  . Lisinopril Cough    HPI  76 y/o male with the above problem list.  He is very active @ home, biking and exercising regularly.  He has not had any chest pain and experiences no limitations in his activities.  He was in his USOH until this AM, when shortly after awakening, he was sitting on the edge of his bed and felt slightly dizzy.  He took his BP and it was 128/77 with a HR of 56 and then 110/68 with a HR of 62.  He did not have any chest pain, sob, or palpitations.  He did not necessarily feel like he might pass out.  He laid back down and Ss resolved immediately.  He then fell back to sleep and when he awoke later, he remained asymptomatic.  He has had no recurrence since.  He called the office to report his Ss and was added onto my schedule.  He currently has no complaints.  Orthostatic VS were performed and are normal.  Home Medications  Prior to Admission medications   Medication Sig Start Date End Date Taking? Authorizing Provider  aspirin 81 MG tablet Take 1 tablet (81 mg total) by mouth daily. 02/07/12 02/06/13 Yes Dayna N Dunn, PA  atorvastatin (LIPITOR) 80 MG tablet Take 1 tablet (80 mg total) by mouth at bedtime. 02/07/12 02/06/13 Yes Dayna N Dunn, PA  carvedilol (COREG) 3.125 MG  tablet Take 1 tablet (3.125 mg total) by mouth 2 (two) times daily with a meal. 02/07/12 02/06/13 Yes Dayna N Dunn, PA  losartan (COZAAR) 50 MG tablet Take 1 tablet (50 mg total) by mouth 2 (two) times daily. 02/07/12  Yes Dayna N Dunn, PA  nitroGLYCERIN (NITROSTAT) 0.4 MG SL tablet Place 1 tablet (0.4 mg total) under the tongue every 5 (five) minutes x 3 doses as needed for chest pain. 02/07/12 02/06/13 Yes Dayna N Dunn, PA  Ticagrelor (BRILINTA) 90 MG TABS tablet Take 1 tablet (90 mg total) by mouth 2 (two) times daily. 02/07/12  Yes Laurann Montana, PA    Review of Systems  Dizziness this AM.  All other systems reviewed and are otherwise negative except as noted above.  Physical Exam  Blood pressure 146/95, pulse 69, height 5\' 11"  (1.803 m), weight 190 lb (86.183 kg).  Orthostatics: Lying- 69, 146/96;  Sitting 61, 145/95;  Standing 72 143/96 (0 mins), 72 141/96 (2 mins), 77 139/96 (5 mins).  asymp throughout. General: Pleasant, NAD Psych: Normal affect. Neuro: Alert and oriented X 3. Moves all extremities spontaneously. HEENT: Normal  Neck: Supple without bruits or JVD. Lungs:  Resp regular and unlabored, CTA. Heart: RRR no s3, s4, or murmurs. Abdomen: Soft, non-tender, non-distended, BS +  x 4.  Extremities: No clubbing, cyanosis or edema. DP/PT/Radials 2+ and equal bilaterally.  Accessory Clinical Findings  ECG - Pt refused.  Assessment & Plan  1.  Dizziness:  Resolved after 15 mins.  Pt took his BP during the episode and it was stable, as was his HR.  ? Etiology.  I offered to perform ECG however he is in a rush to get back to work and wishes to defer.  We also discussed using an event monitor, and given stability of his VS and HR during the event he wishes to defer.  2.  CAD: no chest pain.  Cont asa, bb, statin, brilinta.  Pt reports that he did not take any of his meds today and I counseled him on the importance of compliance, especially as it pertains to brilinta and we provided him with a  sample of brilinta, which he took here in the office.  3.  ICM:  Volume looks good.  Cont bb/arb.  He weighs daily and has been stable.  4.  HTN:  BP elevated today however he did not take morning meds.  5.  HL:  Cont statin therapy.  6.  Dispo:  F/u Dr. Clifton James as scheduled.  Nicolasa Ducking, NP 04/26/2012, 3:41 PM

## 2012-04-26 NOTE — Telephone Encounter (Signed)
Patient states this morning when he first  got out of bed,  he got very dizzy and weak that he had to lay down on the floor. This lasted about 45 minutes. About 9:00 AM pt took his BP it was  128/101, 30 minutes later it was 101/69. Pt has not taken his BP medication today. Pt takes Coreg 3.125 mg and Cozaar 50 mg twice a day. This is the first time this happened.

## 2012-04-28 ENCOUNTER — Ambulatory Visit (HOSPITAL_COMMUNITY): Payer: Medicare Other

## 2012-05-01 ENCOUNTER — Ambulatory Visit (HOSPITAL_COMMUNITY): Payer: Medicare Other

## 2012-05-03 ENCOUNTER — Ambulatory Visit (HOSPITAL_COMMUNITY): Payer: Medicare Other

## 2012-05-05 ENCOUNTER — Ambulatory Visit (HOSPITAL_COMMUNITY): Payer: Medicare Other

## 2012-05-08 ENCOUNTER — Ambulatory Visit (HOSPITAL_COMMUNITY): Payer: Medicare Other

## 2012-05-10 ENCOUNTER — Ambulatory Visit (HOSPITAL_COMMUNITY): Payer: Medicare Other

## 2012-05-12 ENCOUNTER — Ambulatory Visit (HOSPITAL_COMMUNITY): Payer: Medicare Other

## 2012-05-15 ENCOUNTER — Ambulatory Visit (HOSPITAL_COMMUNITY): Payer: Medicare Other

## 2012-05-17 ENCOUNTER — Ambulatory Visit (HOSPITAL_COMMUNITY): Payer: Medicare Other

## 2012-05-19 ENCOUNTER — Ambulatory Visit (HOSPITAL_COMMUNITY): Payer: Medicare Other

## 2012-05-22 ENCOUNTER — Ambulatory Visit (HOSPITAL_COMMUNITY): Payer: Medicare Other

## 2012-05-24 ENCOUNTER — Ambulatory Visit (HOSPITAL_COMMUNITY): Payer: Medicare Other

## 2012-05-26 ENCOUNTER — Ambulatory Visit (HOSPITAL_COMMUNITY): Payer: Medicare Other

## 2012-05-29 ENCOUNTER — Ambulatory Visit (HOSPITAL_COMMUNITY): Payer: Medicare Other

## 2012-05-31 ENCOUNTER — Ambulatory Visit (HOSPITAL_COMMUNITY): Payer: Medicare Other

## 2012-06-02 ENCOUNTER — Ambulatory Visit (HOSPITAL_COMMUNITY): Payer: Medicare Other

## 2012-08-24 ENCOUNTER — Other Ambulatory Visit: Payer: Self-pay | Admitting: Cardiovascular Disease

## 2012-09-25 ENCOUNTER — Ambulatory Visit: Payer: Medicare Other | Admitting: Cardiovascular Disease

## 2012-09-26 ENCOUNTER — Other Ambulatory Visit: Payer: Self-pay | Admitting: Cardiovascular Disease

## 2012-09-26 ENCOUNTER — Ambulatory Visit: Payer: Medicare Other | Admitting: Cardiovascular Disease

## 2012-11-06 ENCOUNTER — Ambulatory Visit (INDEPENDENT_AMBULATORY_CARE_PROVIDER_SITE_OTHER): Payer: Medicare Other | Admitting: Cardiovascular Disease

## 2012-11-06 ENCOUNTER — Encounter: Payer: Self-pay | Admitting: Cardiovascular Disease

## 2012-11-06 VITALS — BP 142/98 | HR 64 | Ht 71.5 in | Wt 191.8 lb

## 2012-11-06 DIAGNOSIS — I2510 Atherosclerotic heart disease of native coronary artery without angina pectoris: Secondary | ICD-10-CM

## 2012-11-06 DIAGNOSIS — I255 Ischemic cardiomyopathy: Secondary | ICD-10-CM

## 2012-11-06 DIAGNOSIS — I2589 Other forms of chronic ischemic heart disease: Secondary | ICD-10-CM

## 2012-11-06 NOTE — Patient Instructions (Addendum)
Your physician wants you to follow-up in:  6 months. You will receive a reminder letter in the mail two months in advance. If you don't receive a letter, please call our office to schedule the follow-up appointment.  Your physician recommends that you return for fasting lab work in:  April 2014--Lipid and Liver profile. The lab opens at 7:30 every week day.

## 2012-11-06 NOTE — Progress Notes (Signed)
History of Present Illness: 77 yo AAM with history of HTN, anterior MI 02/05/12 with occluded LAD now s/p DES x 1 LAD, ischemic cardiomyopathy with LVEF of 40% by echo 03/21/12 who is here today for cardiac follow up. He was admitted 02/05/12 with chest pain, ST elevation c/w anterior MI. Emergent cath with occluded mid LAD. His other vessels were free of obstructive disease. I placed a DES in the mid LAD. He did well but based on his systolic dysfunction, he was discharged home with a LifeVest for prevention of sudden cardiac death due to arrythmias. His echo 03/21/12 showed improvement in LVEF at 40%. His Lifevest was discontinued.   He is here today for follow up. He is doing great. No chest pain or SOB. No palpitations. Energy level is normal. He is riding his bike again and doing well. BP has been well controlled at home (120s/70s).   Primary Care Physician: Gildardo Cranker  Last Lipid Profile:Lipid Panel     Component Value Date/Time   CHOL 155 02/07/2012 0033   TRIG 61 02/07/2012 0033   HDL 66 02/07/2012 0033   CHOLHDL 2.3 02/07/2012 0033   VLDL 12 02/07/2012 0033   LDLCALC 77 02/07/2012 0033     Past Medical History  Diagnosis Date  . Hypertension   . CAD (coronary artery disease)     a. 01/2012 anterior STEMI PTCA/DES x 1 mid LAD  . Ischemic cardiomyopathy     a. 02/06/12 Echo: EF 30-35% s/p LifeVest placement;   b. 03/21/2012 Echo: EF 35-40%, antlat, infsept HK, dist antersept and lat AK, Gr1 DD, lifevest was d/c'd  . Hyperlipidemia     No past surgical history on file.  Current Outpatient Prescriptions  Medication Sig Dispense Refill  . aspirin 81 MG tablet Take 1 tablet (81 mg total) by mouth daily.      Marland Kitchen atorvastatin (LIPITOR) 80 MG tablet TAKE 1 TABLET BY MOUTH EVERY DAY  30 tablet  6  . carvedilol (COREG) 3.125 MG tablet TAKE 1 TABLET BY MOUTH TWICE A DAY  60 tablet  6  . losartan (COZAAR) 50 MG tablet TAKE 1 TABLET TWICE A DAY  60 tablet  6  . nitroGLYCERIN (NITROSTAT) 0.4 MG SL  tablet Place 1 tablet (0.4 mg total) under the tongue every 5 (five) minutes x 3 doses as needed for chest pain.  25 tablet  4  . Ticagrelor (BRILINTA) 90 MG TABS tablet Take 1 tablet (90 mg total) by mouth 2 (two) times daily.  60 tablet  6    Allergies  Allergen Reactions  . Lisinopril Cough    History   Social History  . Marital Status: Divorced    Spouse Name: N/A    Number of Children: N/A  . Years of Education: N/A   Occupational History  . Not on file.   Social History Main Topics  . Smoking status: Former Games developer  . Smokeless tobacco: Never Used  . Alcohol Use: No  . Drug Use: No  . Sexually Active: Yes    Birth Control/ Protection: None   Other Topics Concern  . Not on file   Social History Narrative  . No narrative on file    No family history on file.  Review of Systems:  As stated in the HPI and otherwise negative.   BP 142/98  Pulse 64  Ht 5' 11.5" (1.816 m)  Wt 191 lb 12.8 oz (87 kg)  BMI 26.38 kg/m2  SpO2 98%  Physical  Examination: General: Well developed, well nourished, NAD HEENT: OP clear, mucus membranes moist SKIN: warm, dry. No rashes. Neuro: No focal deficits Musculoskeletal: Muscle strength 5/5 all ext Psychiatric: Mood and affect normal Neck: No JVD, no carotid bruits, no thyromegaly, no lymphadenopathy. Lungs:Clear bilaterally, no wheezes, rhonci, crackles Cardiovascular: Regular rate and rhythm. No murmurs, gallops or rubs. Abdomen:Soft. Bowel sounds present. Non-tender.  Extremities: No lower extremity edema. Pulses are 2 + in the bilateral DP/PT.  EKG: NSR, rate 64 bpm. RBBB.   Assessment and Plan:   1. CAD: Stable. He is tolerating all of his medications well. No chest pain. Will continue ASA/Brilinta/statin. Check lipids/LFTs in 3 months. Continue beta blocker. Will hopefully be able to stop his Brilinta at return visit.   2. Ischemic cardiomyopathy: LVEF 40%. No indication for ICD. Will continue medical management with  ARB, beta blocker. His volume status is good.

## 2012-12-20 ENCOUNTER — Telehealth: Payer: Self-pay | Admitting: Cardiovascular Disease

## 2012-12-20 DIAGNOSIS — I2510 Atherosclerotic heart disease of native coronary artery without angina pectoris: Secondary | ICD-10-CM

## 2012-12-20 MED ORDER — CARVEDILOL 6.25 MG PO TABS: ORAL_TABLET | ORAL | Status: DC

## 2012-12-20 NOTE — Telephone Encounter (Signed)
New Problem    Pt is concerned about his blood pressure being abnormally high. Would like to speak to nurse.

## 2012-12-20 NOTE — Telephone Encounter (Signed)
Dennie Bible, We can increase his Coreg to 6.25 mg po BID and have him follow BP and HR. Let us know results next week. Thanks, chris

## 2012-12-20 NOTE — Telephone Encounter (Signed)
Spoke with pt. He reports blood pressure has been running high recently.  This AM--155/82 2/18-133/82 2/17-129/80 Over weekend--107-133/76-81 Last week--124/79-80  Taking Coreg and Cozaar as listed on med list. Other than concerned about elevated blood pressure he is feeling well. I told pt I would review with Dr. Clifton James and call him with his recommendations.

## 2012-12-20 NOTE — Telephone Encounter (Signed)
Spoke with pt and gave him instructions from Dr. Clifton James. He will make medicine change and call us next week with readings. [facility].

## 2012-12-26 ENCOUNTER — Other Ambulatory Visit: Payer: Self-pay | Admitting: Cardiovascular Disease

## 2013-01-31 ENCOUNTER — Other Ambulatory Visit (INDEPENDENT_AMBULATORY_CARE_PROVIDER_SITE_OTHER): Payer: Medicare Other

## 2013-01-31 DIAGNOSIS — I2510 Atherosclerotic heart disease of native coronary artery without angina pectoris: Secondary | ICD-10-CM

## 2013-01-31 LAB — LIPID PANEL
Cholesterol: 108 mg/dL (ref 0–200)
HDL: 51.5 mg/dL (ref >39.00)
LDL Cholesterol: 51 mg/dL (ref 0–99)
Triglycerides: 29 mg/dL (ref 0.0–149.0)
VLDL: 5.8 mg/dL (ref 0.0–40.0)

## 2013-01-31 LAB — HEPATIC FUNCTION PANEL
Albumin: 3.7 g/dL (ref 3.5–5.2)
Alkaline Phosphatase: 60 U/L (ref 39–117)
Total Protein: 6.6 g/dL (ref 6.0–8.3)

## 2013-02-02 ENCOUNTER — Telehealth: Payer: Self-pay | Admitting: Cardiovascular Disease

## 2013-02-02 NOTE — Telephone Encounter (Signed)
New problem    Put the numbers on the recording so he will know what they are.

## 2013-02-02 NOTE — Telephone Encounter (Signed)
Lipid profile readings left on pt's identified voicemail

## 2013-04-09 ENCOUNTER — Other Ambulatory Visit: Payer: Self-pay | Admitting: Cardiovascular Disease

## 2013-04-09 NOTE — Telephone Encounter (Signed)
..   Requested Prescriptions   Pending Prescriptions Disp Refills  . atorvastatin (LIPITOR) 80 MG tablet [Pharmacy Med Name: ATORVASTATIN 80 MG TABLET] 30 tablet 6    Sig: TAKE 1 TABLET BY MOUTH EVERY DAY

## 2013-04-11 ENCOUNTER — Other Ambulatory Visit: Payer: Self-pay | Admitting: Cardiovascular Disease

## 2013-05-07 ENCOUNTER — Encounter: Payer: Self-pay | Admitting: Cardiovascular Disease

## 2013-05-07 ENCOUNTER — Ambulatory Visit (INDEPENDENT_AMBULATORY_CARE_PROVIDER_SITE_OTHER): Payer: Medicare Other | Admitting: Cardiovascular Disease

## 2013-05-07 VITALS — BP 132/88 | HR 64 | Ht 71.5 in | Wt 191.8 lb

## 2013-05-07 DIAGNOSIS — I10 Essential (primary) hypertension: Secondary | ICD-10-CM

## 2013-05-07 DIAGNOSIS — I255 Ischemic cardiomyopathy: Secondary | ICD-10-CM

## 2013-05-07 DIAGNOSIS — I2589 Other forms of chronic ischemic heart disease: Secondary | ICD-10-CM

## 2013-05-07 DIAGNOSIS — I2510 Atherosclerotic heart disease of native coronary artery without angina pectoris: Secondary | ICD-10-CM

## 2013-05-07 MED ORDER — NITROGLYCERIN 0.4 MG SL SUBL: 0.4000 mg | SUBLINGUAL_TABLET | SUBLINGUAL | Status: DC | PRN

## 2013-05-07 NOTE — Patient Instructions (Addendum)
Your physician wants you to follow-up in:  6 months.  You will receive a reminder letter in the mail two months in advance. If you don't receive a letter, please call our office to schedule the follow-up appointment.  Stop Brinlinta after you finish the bottle you have

## 2013-05-07 NOTE — Progress Notes (Signed)
History of Present Illness: 77 yo AAM with history of HTN, anterior MI 02/05/12 with occluded LAD now s/p DES x 1 LAD, ischemic cardiomyopathy with LVEF of 40% by echo 03/21/12 who is here today for cardiac follow up. He was admitted 02/05/12 with chest pain, ST elevation c/w anterior MI. Emergent cath with occluded mid LAD. His other vessels were free of obstructive disease. I placed a DES in the mid LAD. He did well but based on his systolic dysfunction, he was discharged home with a LifeVest for prevention of sudden cardiac death due to arrythmias. His echo 03/21/12 showed improvement in LVEF at 40%. His Lifevest was discontinued.   He is here today for follow up. He is doing great. No chest pain or SOB. No palpitations. Energy level is normal. He is riding his bike again and doing well. BP has been well controlled at home.   Primary Care Physician: Gildardo Cranker   Last Lipid Profile:Lipid Panel     Component Value Date/Time   CHOL 108 01/31/2013 0736   TRIG 29.0 01/31/2013 0736   HDL 51.50 01/31/2013 0736   CHOLHDL 2 01/31/2013 0736   VLDL 5.8 01/31/2013 0736   LDLCALC 51 01/31/2013 0736     Past Medical History  Diagnosis Date  . Hypertension   . CAD (coronary artery disease)     a. 01/2012 anterior STEMI PTCA/DES x 1 mid LAD  . Ischemic cardiomyopathy     a. 02/06/12 Echo: EF 30-35% s/p LifeVest placement;   b. 03/21/2012 Echo: EF 35-40%, antlat, infsept HK, dist antersept and lat AK, Gr1 DD, lifevest was d/c'd  . Hyperlipidemia     No past surgical history on file.  Current Outpatient Prescriptions  Medication Sig Dispense Refill  . atorvastatin (LIPITOR) 80 MG tablet TAKE 1 TABLET BY MOUTH EVERY DAY  30 tablet  6  . BRILINTA 90 MG TABS tablet TAKE 1 TABLET TWICE A DAY  60 tablet  6  . carvedilol (COREG) 6.25 MG tablet TAKE 1 TABLET BY MOUTH TWICE A DAY  60 tablet  6  . losartan (COZAAR) 50 MG tablet TAKE 1 TABLET TWICE A DAY  60 tablet  6  . nitroGLYCERIN (NITROSTAT) 0.4 MG SL tablet Place  1 tablet (0.4 mg total) under the tongue every 5 (five) minutes x 3 doses as needed for chest pain.  25 tablet  4   No current facility-administered medications for this visit.    Allergies  Allergen Reactions  . Lisinopril Cough    History   Social History  . Marital Status: Divorced    Spouse Name: N/A    Number of Children: N/A  . Years of Education: N/A   Occupational History  . Not on file.   Social History Main Topics  . Smoking status: Former Games developer  . Smokeless tobacco: Never Used  . Alcohol Use: No  . Drug Use: No  . Sexually Active: Yes    Birth Control/ Protection: None   Other Topics Concern  . Not on file   Social History Narrative  . No narrative on file    No family history on file.  Review of Systems:  As stated in the HPI and otherwise negative.   BP 132/88  Pulse 64  Ht 5' 11.5" (1.816 m)  Wt 191 lb 12.8 oz (87 kg)  BMI 26.38 kg/m2  Physical Examination: General: Well developed, well nourished, NAD HEENT: OP clear, mucus membranes moist SKIN: warm, dry. No rashes. Neuro: No  focal deficits Musculoskeletal: Muscle strength 5/5 all ext Psychiatric: Mood and affect normal Neck: No JVD, no carotid bruits, no thyromegaly, no lymphadenopathy. Lungs:Clear bilaterally, no wheezes, rhonci, crackles Cardiovascular: Regular rate and rhythm. No murmurs, gallops or rubs. Abdomen:Soft. Bowel sounds present. Non-tender.  Extremities: No lower extremity edema. Pulses are 2 + in the bilateral DP/PT.  Assessment and Plan:   1. CAD: Stable. He is tolerating all of his medications well. No chest pain. Will continue ASA 81 mg po Qdaily. Will stop Brilinta. Continue beta blocker and statin.   2. Ischemic cardiomyopathy: LVEF 40%. No indication for ICD. Will continue medical management with ARB, beta blocker. His volume status is good.

## 2013-05-14 ENCOUNTER — Other Ambulatory Visit: Payer: Self-pay | Admitting: *Deleted

## 2013-05-14 ENCOUNTER — Other Ambulatory Visit: Payer: Self-pay | Admitting: Cardiovascular Disease

## 2013-05-14 DIAGNOSIS — I2510 Atherosclerotic heart disease of native coronary artery without angina pectoris: Secondary | ICD-10-CM

## 2013-05-14 MED ORDER — ATORVASTATIN CALCIUM 80 MG PO TABS: ORAL_TABLET | ORAL | Status: DC

## 2013-05-14 MED ORDER — LOSARTAN POTASSIUM 50 MG PO TABS: ORAL_TABLET | ORAL | Status: DC

## 2013-05-14 MED ORDER — CARVEDILOL 6.25 MG PO TABS: ORAL_TABLET | ORAL | Status: DC

## 2013-10-15 ENCOUNTER — Telehealth: Payer: Self-pay | Admitting: *Deleted

## 2013-10-15 NOTE — Telephone Encounter (Signed)
Formulary change for 2015 losartan 50 mg 1 daily, Dr Clifton James request prior authorization, sent today to [facility].

## 2013-10-16 NOTE — Telephone Encounter (Signed)
Received letter from [facility] [place] is not required at this time for losartan potassium.

## 2013-10-30 ENCOUNTER — Other Ambulatory Visit: Payer: Self-pay | Admitting: Cardiovascular Disease

## 2013-11-15 ENCOUNTER — Ambulatory Visit (INDEPENDENT_AMBULATORY_CARE_PROVIDER_SITE_OTHER): Payer: Medicare HMO | Admitting: Cardiovascular Disease

## 2013-11-15 ENCOUNTER — Encounter: Payer: Self-pay | Admitting: Cardiovascular Disease

## 2013-11-15 VITALS — BP 146/96 | HR 54 | Ht 71.5 in | Wt 190.0 lb

## 2013-11-15 DIAGNOSIS — I2510 Atherosclerotic heart disease of native coronary artery without angina pectoris: Secondary | ICD-10-CM

## 2013-11-15 DIAGNOSIS — I10 Essential (primary) hypertension: Secondary | ICD-10-CM

## 2013-11-15 DIAGNOSIS — I255 Ischemic cardiomyopathy: Secondary | ICD-10-CM

## 2013-11-15 DIAGNOSIS — I2589 Other forms of chronic ischemic heart disease: Secondary | ICD-10-CM

## 2013-11-15 MED ORDER — CLOPIDOGREL BISULFATE 75 MG PO TABS: 75.0000 mg | ORAL_TABLET | Freq: Every day | ORAL | Status: DC

## 2013-11-15 NOTE — Patient Instructions (Signed)
Your physician wants you to follow-up in: 6 months.   You will receive a reminder letter in the mail two months in advance. If you don't receive a letter, please call our office to schedule the follow-up appointment.  Your physician has recommended you make the following change in your medication: start Plavix 75 mg by mouth ONCE A DAY

## 2013-11-15 NOTE — Progress Notes (Signed)
History of Present Illness: 78 yo AAM with history of HTN, anterior MI 02/05/12 with occluded LAD now s/p DES x 1 LAD, ischemic cardiomyopathy with LVEF of 40% by echo 03/21/12 who is here today for cardiac follow up. He was admitted 02/05/12 with chest pain, ST elevation c/w anterior MI. Emergent cath with occluded mid LAD. His other vessels were free of obstructive disease. I placed a DES in the mid LAD. He did well but based on his systolic dysfunction, he was discharged home with a LifeVest for prevention of sudden cardiac death due to arrythmias. His echo 03/21/12 showed improvement in LVEF at 40%. His Lifevest was discontinued.   He is here today for follow up. He is doing great. No chest pain or SOB. No palpitations. Energy level is normal. He is riding his bike again and doing well. BP has been well controlled at home.   Primary Care Physician: Lona Kettle   Last Lipid Profile:Lipid Panel     Component Value Date/Time   CHOL 108 01/31/2013 0736   TRIG 29.0 01/31/2013 0736   HDL 51.50 01/31/2013 0736   CHOLHDL 2 01/31/2013 0736   VLDL 5.8 01/31/2013 0736   LDLCALC 51 01/31/2013 0736     Past Medical History  Diagnosis Date  . Hypertension   . CAD (coronary artery disease)     a. 01/2012 anterior STEMI PTCA/DES x 1 mid LAD  . Ischemic cardiomyopathy     a. 02/06/12 Echo: EF 30-35% s/p LifeVest placement;   b. 03/21/2012 Echo: EF 35-40%, antlat, infsept HK, dist antersept and lat AK, Gr1 DD, lifevest was d/c'd  . Hyperlipidemia     No past surgical history on file.  Current Outpatient Prescriptions  Medication Sig Dispense Refill  . aspirin 81 MG tablet Take 81 mg by mouth daily.      Marland Kitchen atorvastatin (LIPITOR) 80 MG tablet TAKE 1 TABLET BY MOUTH EVERY DAY  30 tablet  6  . BRILINTA 90 MG TABS tablet TAKE 1 TABLET TWICE A DAY  60 tablet  6  . carvedilol (COREG) 6.25 MG tablet TAKE 1 TABLET BY MOUTH TWICE A DAY  180 tablet  0  . losartan (COZAAR) 50 MG tablet Take 150 mg by mouth 2 (two)  times daily. TAKE 1 TABLET TWICE A DAY      . nitroGLYCERIN (NITROSTAT) 0.4 MG SL tablet Place 1 tablet (0.4 mg total) under the tongue every 5 (five) minutes x 3 doses as needed for chest pain.  25 tablet  6   No current facility-administered medications for this visit.    Allergies  Allergen Reactions  . Lisinopril Cough    History   Social History  . Marital Status: Divorced    Spouse Name: N/A    Number of Children: N/A  . Years of Education: N/A   Occupational History  . Not on file.   Social History Main Topics  . Smoking status: Former Research scientist (life sciences)  . Smokeless tobacco: Never Used  . Alcohol Use: No  . Drug Use: No  . Sexual Activity: Yes    Birth Control/ Protection: None   Other Topics Concern  . Not on file   Social History Narrative  . No narrative on file    No family history on file.  Review of Systems:  As stated in the HPI and otherwise negative.   BP 146/96  Pulse 54  Ht 5' 11.5" (1.816 m)  Wt 190 lb (86.183 kg)  BMI 26.13  kg/m2  Physical Examination: General: Well developed, well nourished, NAD HEENT: OP clear, mucus membranes moist SKIN: warm, dry. No rashes. Neuro: No focal deficits Musculoskeletal: Muscle strength 5/5 all ext Psychiatric: Mood and affect normal Neck: No JVD, no carotid bruits, no thyromegaly, no lymphadenopathy. Lungs:Clear bilaterally, no wheezes, rhonci, crackles Cardiovascular: Regular rate and rhythm. No murmurs, gallops or rubs. Abdomen:Soft. Bowel sounds present. Non-tender.  Extremities: No lower extremity edema. Pulses are 2 + in the bilateral DP/PT.  Assessment and Plan:   1. CAD: Stable. He is tolerating all of his medications well. No chest pain. Will continue ASA 81 mg po Qdaily. Based on findings in recent DAPT trial, outcomes are better in patients with prior drug eluting stents if they remain on ASA and Plavix. Will start Plavix 75 mg po Qdaily.  Continue beta blocker and statin. He is very active.   2.  Ischemic cardiomyopathy: LVEF 40%. No indication for ICD. Will continue medical management with ARB, beta blocker. His volume status is normal.   3. HTN: BP controlled at home.

## 2014-01-08 ENCOUNTER — Telehealth: Payer: Self-pay | Admitting: Cardiovascular Disease

## 2014-01-08 MED ORDER — LOSARTAN POTASSIUM 50 MG PO TABS: 50.0000 mg | ORAL_TABLET | Freq: Two times a day (BID) | ORAL | Status: DC

## 2014-01-08 NOTE — Telephone Encounter (Signed)
Spoke with pt regarding blood pressure readings below. Primary care did not change medications as pt states he told him he wanted Dr. Angelena Form to adjust if needed. Pt states blood pressures at home have been elevated recently --130/80's. Heart rate in mid 50's.  Pt is feeling well but concerned over blood pressure readings. He is hesitant to increase medicines but will do so if Dr. Angelena Form feels he needs to do this. He also reports primary MD told him he heard sound in heart yesterday like valve opening and closing. Pt does not remember primary MD describing this as murmur or skipping sound to heart.  Pt is asking Dr. Camillia Herter thoughts on this

## 2014-01-08 NOTE — Telephone Encounter (Signed)
New message    Over the past   4-5 days blood pressure was up . Went to pcp 150 /98 on yesterday .   This am x 2  140/ 89 @ 8:00  Re- check  141/87 @ 8:21 .   No chest pain , no sob. Feel good .

## 2014-01-08 NOTE — Telephone Encounter (Signed)
Spoke with pt and gave him information from Dr. Angelena Form. He is not having any palpitations.  Medication list indicates pt is taking Cozaar 150 mg twice daily. I confirmed with pt that he has been taking Cozaar 50 mg twice daily. Pt has been on this dose since office visit in April 2013 and I do not see a change has been made. Will update med list. [facility] 50 mg twice daily.

## 2014-01-08 NOTE — Telephone Encounter (Signed)
Left message to call back  

## 2014-01-08 NOTE — Telephone Encounter (Signed)
Follow up  ° ° °Patient returning call back to nurse  °

## 2014-01-08 NOTE — Telephone Encounter (Signed)
No change in BP meds. Continue to monitor at home and let us know if consistently over 140/90. No valve disease on echo 2013. Not sure if they described hearing skipped beats? Has he felt palpitatoins?

## 2014-03-01 ENCOUNTER — Other Ambulatory Visit: Payer: Self-pay | Admitting: Cardiovascular Disease

## 2014-03-01 ENCOUNTER — Telehealth: Payer: Self-pay | Admitting: Cardiovascular Disease

## 2014-03-01 MED ORDER — AMLODIPINE BESYLATE 5 MG PO TABS
5.0000 mg | ORAL_TABLET | Freq: Every day | ORAL | Status: DC
Start: 2014-03-01 — End: 2014-03-06

## 2014-03-01 NOTE — Telephone Encounter (Signed)
Spoke with pt and gave him instructions from Dr. Angelena Form. [facility] [address]

## 2014-03-01 NOTE — Telephone Encounter (Signed)
Add Norvasc 5 mg daily. cdm

## 2014-03-01 NOTE — Telephone Encounter (Signed)
Spoke with pt who reports blood pressure readings as listed below.  He reports other recent readings of 135/77, 156/95, 148/90.  Most readings are in the 140-160/upper 70's to low 90's. Heart rate in the 60's.  Slight headache and feeling sluggish at times but otherwise no complaints.  Taking Coreg and Cozaar as listed.

## 2014-03-01 NOTE — Telephone Encounter (Signed)
New message    Patient calling C/O blood pressure issues for a week now  With different reading . Today done on home machine 149/87 , [facility]  137/88

## 2014-03-04 ENCOUNTER — Telehealth: Payer: Self-pay | Admitting: Cardiovascular Disease

## 2014-03-04 NOTE — Telephone Encounter (Signed)
New message     Patient calling medication change side effect  Amlodipine 5 mg - having headache, vision problem in left eye . Blood pressure still up  . Today 147/91 . Pulse 65.

## 2014-03-04 NOTE — Telephone Encounter (Signed)
Agree. thanks

## 2014-03-04 NOTE — Telephone Encounter (Signed)
Spoke with pt who reports he started amlodipine on Saturday. Sunday afternoon he developed headache. Headache improved earlier today but is not completely gone.  Also reports loss of peripheral vision in left eye Sunday afternoon. This lasted about 5-10 minutes. Loss of peripheral vision in left eye returned Sunday evening and lasted until this AM.  Vision back to normal at this time.  No weakness, numbness, tingling, slurred speech.  Reports he had similar episode several years ago.  He has eye doctor and will schedule appt with him.  I offered pt appointment tomorrow with NP but he is out of town at a conference.  Appt made for pt to see Dr Angelena Form on Mar 06, 2014 at 2:00.  Pt aware to call EMS if problems prior to this appt.

## 2014-03-06 ENCOUNTER — Ambulatory Visit (INDEPENDENT_AMBULATORY_CARE_PROVIDER_SITE_OTHER): Payer: Medicare HMO | Admitting: Cardiovascular Disease

## 2014-03-06 ENCOUNTER — Encounter: Payer: Self-pay | Admitting: Cardiovascular Disease

## 2014-03-06 VITALS — BP 138/82 | HR 76 | Ht 71.5 in | Wt 185.0 lb

## 2014-03-06 DIAGNOSIS — I255 Ischemic cardiomyopathy: Secondary | ICD-10-CM

## 2014-03-06 DIAGNOSIS — I2510 Atherosclerotic heart disease of native coronary artery without angina pectoris: Secondary | ICD-10-CM

## 2014-03-06 DIAGNOSIS — I10 Essential (primary) hypertension: Secondary | ICD-10-CM

## 2014-03-06 DIAGNOSIS — H539 Unspecified visual disturbance: Secondary | ICD-10-CM

## 2014-03-06 DIAGNOSIS — I2589 Other forms of chronic ischemic heart disease: Secondary | ICD-10-CM

## 2014-03-06 MED ORDER — HYDROCHLOROTHIAZIDE 25 MG PO TABS: 25.0000 mg | ORAL_TABLET | Freq: Every day | ORAL | Status: DC

## 2014-03-06 NOTE — Patient Instructions (Signed)
Your physician recommends that you schedule a follow-up appointment in: about 3 weeks.    Your physician has requested that you have a carotid duplex. This test is an ultrasound of the carotid arteries in your neck. It looks at blood flow through these arteries that supply the brain with blood. Allow one hour for this exam. There are no restrictions or special instructions.   Your physician has recommended you make the following change in your medication:  Stop Norvasc.  Start Hydrochlorothiazide 25 mg by mouth daily.

## 2014-03-06 NOTE — Progress Notes (Signed)
History of Present Illness: 78 yo AAM with history of HTN, CAD with anterior MI 02/05/12 with occluded LAD now s/p DES x 1 LAD, ischemic cardiomyopathy with LVEF of 40% by echo 03/21/12 who is here today for cardiac follow up. He was admitted 02/05/12 with chest pain, ST elevation c/w anterior MI. Emergent cath with occluded mid LAD. His other vessels were free of obstructive disease. I placed a DES in the mid LAD. He did well but based on his systolic dysfunction, he was discharged home with a LifeVest for prevention of sudden cardiac death due to arrythmias. His echo 03/21/12 showed improvement in LVEF at 40%.    He is here today for follow up. He has had recent elevations in BP and was started on Norvasc 5 mg daily on 03/01/14. On 03/03/14 he had onset of headache and had changes in his vision. There was loss of peripheral vision in his left eye. This lasted for several hours. His vision has almost returned to normal. He has an appt with his eye doctor tomorrow. Otherwise feeling well. Exercising every day.  Primary Care Physician: Lona Kettle   Last Lipid Profile:Lipid Panel     Component Value Date/Time   CHOL 108 01/31/2013 0736   TRIG 29.0 01/31/2013 0736   HDL 51.50 01/31/2013 0736   CHOLHDL 2 01/31/2013 0736   VLDL 5.8 01/31/2013 0736   LDLCALC 51 01/31/2013 0736     Past Medical History  Diagnosis Date  . Hypertension   . CAD (coronary artery disease)     a. 01/2012 anterior STEMI PTCA/DES x 1 mid LAD  . Ischemic cardiomyopathy     a. 02/06/12 Echo: EF 30-35% s/p LifeVest placement;   b. 03/21/2012 Echo: EF 35-40%, antlat, infsept HK, dist antersept and lat AK, Gr1 DD, lifevest was d/c'd  . Hyperlipidemia     No past surgical history on file.  Current Outpatient Prescriptions  Medication Sig Dispense Refill  . aspirin 81 MG tablet Take 81 mg by mouth daily.      Marland Kitchen atorvastatin (LIPITOR) 80 MG tablet TAKE 1 TABLET BY MOUTH EVERY DAY  30 tablet  6  . carvedilol (COREG) 6.25 MG tablet TAKE 1  TABLET BY MOUTH TWICE A DAY  180 tablet  0  . clopidogrel (PLAVIX) 75 MG tablet Take 1 tablet (75 mg total) by mouth daily.  30 tablet  6  . losartan (COZAAR) 50 MG tablet Take 1 tablet (50 mg total) by mouth 2 (two) times daily.  60 tablet  3  . nitroGLYCERIN (NITROSTAT) 0.4 MG SL tablet Place 1 tablet (0.4 mg total) under the tongue every 5 (five) minutes x 3 doses as needed for chest pain.  25 tablet  6   No current facility-administered medications for this visit.    Allergies  Allergen Reactions  . Lisinopril Cough    History   Social History  . Marital Status: Divorced    Spouse Name: N/A    Number of Children: N/A  . Years of Education: N/A   Occupational History  . Not on file.   Social History Main Topics  . Smoking status: Former Research scientist (life sciences)  . Smokeless tobacco: Never Used  . Alcohol Use: No  . Drug Use: No  . Sexual Activity: Yes    Birth Control/ Protection: None   Other Topics Concern  . Not on file   Social History Narrative  . No narrative on file    Family History  Problem Relation  Age of Onset  . CAD Neg Hx     Review of Systems:  As stated in the HPI and otherwise negative.   BP 138/82  Pulse 76  Ht 5' 11.5" (1.816 m)  Wt 185 lb (83.915 kg)  BMI 25.45 kg/m2  Physical Examination: General: Well developed, well nourished, NAD HEENT: OP clear, mucus membranes moist SKIN: warm, dry. No rashes. Neuro: No focal deficits Musculoskeletal: Muscle strength 5/5 all ext Psychiatric: Mood and affect normal Neck: No JVD, no carotid bruits, no thyromegaly, no lymphadenopathy. Lungs:Clear bilaterally, no wheezes, rhonci, crackles Cardiovascular: Regular rate and rhythm. No murmurs, gallops or rubs. Abdomen:Soft. Bowel sounds present. Non-tender.  Extremities: No lower extremity edema. Pulses are 2 + in the bilateral DP/PT.  Assessment and Plan:   1. CAD: Stable. He is tolerating all of his medications well. No chest pain. Will continue ASA 81 mg po  Qdaily. Based on findings in recent DAPT trial, outcomes are better in patients with prior drug eluting stents if they remain on ASA and Plavix. Will continue Plavix 75 mg po Qdaily.  Continue beta blocker and statin. He is very active.   2. Ischemic cardiomyopathy: LVEF 40%. No indication for ICD. Will continue medical management with ARB, beta blocker. His volume status is normal.   3. HTN: BP has still been elevated at home. Will not restart Norvasc as this was related to timing of his neurological changes, vision changes. Will start HCTZ 25 mg once daily, continue Coreg and Cozaar.   4. Visual changes/headache: It is possible that his visual changes are related to his HTN. This could represent a TIA. Will arrange carotid artery dopplers. He is seeing his eye doctor in am. Attempt better BP control. Continue ASA and Plavix.

## 2014-03-07 ENCOUNTER — Telehealth: Payer: Self-pay | Admitting: Cardiovascular Disease

## 2014-03-07 NOTE — Telephone Encounter (Signed)
New message    FYI for Dillon Middleton when she returns Went to eye doctor and had an examanation---everything is good.   He said he had experienced a migraine.  Thanks for everything. You do not have to call him when you return.  Patient knows Dillon Middleton is out of office today.

## 2014-03-08 NOTE — Telephone Encounter (Signed)
thanks

## 2014-03-14 ENCOUNTER — Encounter (HOSPITAL_COMMUNITY): Payer: Medicare HMO

## 2014-03-15 ENCOUNTER — Other Ambulatory Visit: Payer: Self-pay | Admitting: Cardiovascular Disease

## 2014-03-21 ENCOUNTER — Ambulatory Visit (HOSPITAL_COMMUNITY): Payer: Medicare HMO | Attending: Cardiovascular Disease | Admitting: *Deleted

## 2014-03-21 DIAGNOSIS — H53129 Transient visual loss, unspecified eye: Secondary | ICD-10-CM | POA: Insufficient documentation

## 2014-03-21 DIAGNOSIS — H539 Unspecified visual disturbance: Secondary | ICD-10-CM

## 2014-03-21 DIAGNOSIS — I6529 Occlusion and stenosis of unspecified carotid artery: Secondary | ICD-10-CM

## 2014-03-21 NOTE — Progress Notes (Signed)
Carotid duplex complete 

## 2014-03-28 ENCOUNTER — Ambulatory Visit (INDEPENDENT_AMBULATORY_CARE_PROVIDER_SITE_OTHER): Payer: Medicare HMO | Admitting: Cardiovascular Disease

## 2014-03-28 ENCOUNTER — Encounter: Payer: Self-pay | Admitting: Cardiovascular Disease

## 2014-03-28 VITALS — BP 102/74 | HR 70 | Ht 71.5 in | Wt 187.0 lb

## 2014-03-28 DIAGNOSIS — I10 Essential (primary) hypertension: Secondary | ICD-10-CM

## 2014-03-28 DIAGNOSIS — H539 Unspecified visual disturbance: Secondary | ICD-10-CM

## 2014-03-28 NOTE — Progress Notes (Signed)
History of Present Illness: 78 yo AAM with history of HTN, CAD with anterior MI 02/05/12 with occluded LAD now s/p DES x 1 LAD, ischemic cardiomyopathy with LVEF of 40% by echo 03/21/12 who is here today for cardiac follow up. He was admitted 02/05/12 with chest pain, ST elevation c/w anterior MI. Emergent cath with occluded mid LAD. His other vessels were free of obstructive disease. I placed a DES in the mid LAD. He did well but based on his systolic dysfunction, he was discharged home with a LifeVest for prevention of sudden cardiac death due to arrythmias. His echo 03/21/12 showed improvement in LVEF at 40%. He was seen here May 2015 and his BP was elevated. He was started on Norvasc but had headache and visual changes. Norvasc was stopped and HCTZ was started. No other neurological issues. Carotid dopplers 03/21/14 with mild bilateral disease.   He is here today for follow up of his BP. He is feeling well. BP has been low at home, 01B systolic.   Primary Care Physician: Lona Kettle   Last Lipid Profile:Lipid Panel     Component Value Date/Time   CHOL 108 01/31/2013 0736   TRIG 29.0 01/31/2013 0736   HDL 51.50 01/31/2013 0736   CHOLHDL 2 01/31/2013 0736   VLDL 5.8 01/31/2013 0736   LDLCALC 51 01/31/2013 0736     Past Medical History  Diagnosis Date  . Hypertension   . CAD (coronary artery disease)     a. 01/2012 anterior STEMI PTCA/DES x 1 mid LAD  . Ischemic cardiomyopathy     a. 02/06/12 Echo: EF 30-35% s/p LifeVest placement;   b. 03/21/2012 Echo: EF 35-40%, antlat, infsept HK, dist antersept and lat AK, Gr1 DD, lifevest was d/c'd  . Hyperlipidemia     No past surgical history on file.  Current Outpatient Prescriptions  Medication Sig Dispense Refill  . amLODipine (NORVASC) 5 MG tablet       . aspirin 81 MG tablet Take 81 mg by mouth daily.      Marland Kitchen atorvastatin (LIPITOR) 80 MG tablet TAKE 1 TABLET BY MOUTH EVERY DAY  30 tablet  6  . carvedilol (COREG) 6.25 MG tablet TAKE 1 TABLET BY  MOUTH TWICE A DAY  180 tablet  0  . clopidogrel (PLAVIX) 75 MG tablet Take 1 tablet (75 mg total) by mouth daily.  30 tablet  6  . hydrochlorothiazide (HYDRODIURIL) 25 MG tablet Take 1 tablet (25 mg total) by mouth daily.  30 tablet  11  . losartan (COZAAR) 50 MG tablet TAKE 1 TABLET TWICE A DAY  180 tablet  0  . nitroGLYCERIN (NITROSTAT) 0.4 MG SL tablet Place 1 tablet (0.4 mg total) under the tongue every 5 (five) minutes x 3 doses as needed for chest pain.  25 tablet  6   No current facility-administered medications for this visit.    Allergies  Allergen Reactions  . Lisinopril Cough    History   Social History  . Marital Status: Divorced    Spouse Name: N/A    Number of Children: N/A  . Years of Education: N/A   Occupational History  . Not on file.   Social History Main Topics  . Smoking status: Former Research scientist (life sciences)  . Smokeless tobacco: Never Used  . Alcohol Use: No  . Drug Use: No  . Sexual Activity: Yes    Birth Control/ Protection: None   Other Topics Concern  . Not on file   Social  History Narrative  . No narrative on file    Family History  Problem Relation Age of Onset  . CAD Neg Hx     Review of Systems:  As stated in the HPI and otherwise negative.   BP 102/74  Pulse 70  Ht 5' 11.5" (1.816 m)  Wt 187 lb (84.823 kg)  BMI 25.72 kg/m2  Physical Examination: General: Well developed, well nourished, NAD HEENT: OP clear, mucus membranes moist SKIN: warm, dry. No rashes. Neuro: No focal deficits Musculoskeletal: Muscle strength 5/5 all ext Psychiatric: Mood and affect normal Neck: No JVD, no carotid bruits, no thyromegaly, no lymphadenopathy. Lungs:Clear bilaterally, no wheezes, rhonci, crackles Cardiovascular: Regular rate and rhythm. No murmurs, gallops or rubs. Abdomen:Soft. Bowel sounds present. Non-tender.  Extremities: No lower extremity edema. Pulses are 2 + in the bilateral DP/PT.  Assessment and Plan:   1. CAD: Stable. He is tolerating all  of his medications well. No chest pain. Will continue ASA 81 mg po Qdaily and Plavix 75 mg daily. Continue beta blocker and statin. He is very active.   2. Ischemic cardiomyopathy: LVEF 40%. No indication for ICD. Will continue medical management with ARB, beta blocker. His volume status is normal.   3. HTN: BP is controlled but low. He will hold HCTZ for now and follow BP.   4. Visual changes/headache: Likely related to a migraine. Carotids with mild disease.

## 2014-03-28 NOTE — Patient Instructions (Signed)
Your physician wants you to follow-up in:  6 months.  You will receive a reminder letter in the mail two months in advance. If you don't receive a letter, please call our office to schedule the follow-up appointment.  Your physician has recommended you make the following change in your medication:  Stop Hydrochlorothiazide.

## 2014-05-22 ENCOUNTER — Ambulatory Visit: Payer: Medicare HMO | Admitting: Cardiovascular Disease

## 2014-05-26 ENCOUNTER — Other Ambulatory Visit: Payer: Self-pay | Admitting: Cardiovascular Disease

## 2014-06-12 ENCOUNTER — Other Ambulatory Visit: Payer: Self-pay | Admitting: Cardiovascular Disease

## 2014-06-19 ENCOUNTER — Telehealth: Payer: Self-pay | Admitting: Cardiovascular Disease

## 2014-06-19 MED ORDER — ATORVASTATIN CALCIUM 80 MG PO TABS
ORAL_TABLET | ORAL | Status: DC
Start: 2014-06-19 — End: 2015-04-03

## 2014-06-19 MED ORDER — AMLODIPINE BESYLATE 5 MG PO TABS: 5.0000 mg | ORAL_TABLET | Freq: Every day | ORAL | Status: DC

## 2014-06-19 MED ORDER — LOSARTAN POTASSIUM 50 MG PO TABS: ORAL_TABLET | ORAL | Status: DC

## 2014-06-19 MED ORDER — CLOPIDOGREL BISULFATE 75 MG PO TABS: 75.0000 mg | ORAL_TABLET | Freq: Every day | ORAL | Status: DC

## 2014-06-19 MED ORDER — CARVEDILOL 6.25 MG PO TABS: ORAL_TABLET | ORAL | Status: DC

## 2014-06-19 MED ORDER — NITROGLYCERIN 0.4 MG SL SUBL: 0.4000 mg | SUBLINGUAL_TABLET | SUBLINGUAL | Status: DC | PRN

## 2014-06-19 NOTE — Telephone Encounter (Signed)
New message            Pt would like a call from [facility] / pt would not disclose any other info

## 2014-06-19 NOTE — Telephone Encounter (Signed)
Spoke with pt who reports he can now get prescriptions through [facility] and is requesting 90 day supply sent in. Will send in refills of cardiac medications.

## 2014-07-13 ENCOUNTER — Other Ambulatory Visit: Payer: Self-pay | Admitting: Cardiovascular Disease

## 2014-08-14 ENCOUNTER — Telehealth: Payer: Self-pay | Admitting: Cardiovascular Disease

## 2014-08-14 DIAGNOSIS — I10 Essential (primary) hypertension: Secondary | ICD-10-CM

## 2014-08-14 DIAGNOSIS — E785 Hyperlipidemia, unspecified: Secondary | ICD-10-CM

## 2014-08-14 MED ORDER — HYDROCHLOROTHIAZIDE 12.5 MG PO TABS: 12.5000 mg | ORAL_TABLET | Freq: Every day | ORAL | Status: DC

## 2014-08-14 NOTE — Telephone Encounter (Signed)
Reviewed with Truitt Merle, NP and pt should start HCTZ  12.5 mg by mouth daily (half previous dose) and have BMP in 2 weeks. I spoke with pt and gave him this information. He will come in for [facility] on October 28,2015.  Is also due to have lipid and liver profiles checked and will have done at that time. Will send [facility].

## 2014-08-14 NOTE — Telephone Encounter (Signed)
Spoke with pt who reports blood pressure has been up for last 2 weeks. Readings 130-150/85-90.  Today it is 149/92 and 152/90's.  Heart rate 65- low 70's.  He is feeling fine. Rode bike this past weekend with no problems. Is taking medications as listed.  HCTZ stopped at last office visit due to low blood pressure readings. He resumed amlodipine once it was determined visual changes not related to this medication.

## 2014-08-14 NOTE — Telephone Encounter (Signed)
Agree 

## 2014-08-14 NOTE — Telephone Encounter (Signed)
New message     Talk to Fraser Din regarding his bp

## 2014-08-16 NOTE — Telephone Encounter (Signed)
Agree. thanks

## 2014-08-26 ENCOUNTER — Encounter: Payer: Self-pay | Admitting: Cardiovascular Disease

## 2014-08-28 ENCOUNTER — Other Ambulatory Visit: Payer: Medicare HMO

## 2014-08-28 ENCOUNTER — Telehealth: Payer: Self-pay | Admitting: Cardiovascular Disease

## 2014-08-28 NOTE — Addendum Note (Signed)
Addended by: Thompson Grayer on: 08/28/2014 08:54 AM   Modules accepted: Orders

## 2014-08-28 NOTE — Telephone Encounter (Signed)
Agree. cdm 

## 2014-08-28 NOTE — Telephone Encounter (Signed)
Spoke with pt. Lab work recently scanned in system is from March. This includes lipid and liver profiles. I told pt he did not need these rechecked at this time. He does need BMP due to recently resuming Lisinopril. Pt then reported he did not start Lisinopril because pharmacist told him it could affect kidney function. Pt reports blood pressure has now been running 125-127/79.  He would still like to have BMP checked to determine baseline kidney function. He will come to office tomorrow to have lab work done.  He will have BP checked again at pharmacy and let us know this reading. He will not start Lisinopril until he hears from [place] regarding lab results and recent blood pressure readings.

## 2014-08-28 NOTE — Telephone Encounter (Signed)
New message     Pt is due to have lab work this am.  He just had labs drawn at Dr Melinda Crutch' office.  Did we get the labs?  If yes, will he still need to come to have our labs drawn?

## 2014-08-29 ENCOUNTER — Other Ambulatory Visit (INDEPENDENT_AMBULATORY_CARE_PROVIDER_SITE_OTHER): Payer: Medicare HMO | Admitting: *Deleted

## 2014-08-29 DIAGNOSIS — I10 Essential (primary) hypertension: Secondary | ICD-10-CM

## 2014-08-29 LAB — BASIC METABOLIC PANEL
BUN: 14 mg/dL (ref 6–23)
CALCIUM: 9.4 mg/dL (ref 8.4–10.5)
CO2: 28 mEq/L (ref 19–32)
CREATININE: 1.1 mg/dL (ref 0.4–1.5)
Chloride: 107 mEq/L (ref 96–112)
GFR: 80.31 mL/min (ref >60.00)
Glucose, Bld: 62 mg/dL — ABNORMAL LOW (ref 70–99)
Potassium: 4.3 mEq/L (ref 3.5–5.1)
Sodium: 139 mEq/L (ref 135–145)

## 2014-09-03 NOTE — Telephone Encounter (Signed)
Follow up      Pt want lab results

## 2014-09-03 NOTE — Telephone Encounter (Signed)
Notified the pt of BMET showing ok per Dr Angelena Form.  Pt verbalized understanding.

## 2014-09-30 ENCOUNTER — Encounter: Payer: Self-pay | Admitting: Cardiovascular Disease

## 2014-09-30 ENCOUNTER — Ambulatory Visit (INDEPENDENT_AMBULATORY_CARE_PROVIDER_SITE_OTHER): Payer: Commercial Managed Care - HMO | Admitting: Cardiovascular Disease

## 2014-09-30 VITALS — BP 142/98 | HR 65 | Ht 71.5 in | Wt 191.0 lb

## 2014-09-30 DIAGNOSIS — I10 Essential (primary) hypertension: Secondary | ICD-10-CM

## 2014-09-30 DIAGNOSIS — E785 Hyperlipidemia, unspecified: Secondary | ICD-10-CM

## 2014-09-30 DIAGNOSIS — I2510 Atherosclerotic heart disease of native coronary artery without angina pectoris: Secondary | ICD-10-CM

## 2014-09-30 DIAGNOSIS — I255 Ischemic cardiomyopathy: Secondary | ICD-10-CM

## 2014-09-30 NOTE — Patient Instructions (Addendum)
Your physician wants you to follow-up in:  6 months.  You will receive a reminder letter in the mail two months in advance. If you don't receive a letter, please call our office to schedule the follow-up appointment.  Resume amlodipine 5 mg by mouth daily

## 2014-09-30 NOTE — Progress Notes (Signed)
History of Present Illness: 78 yo AAM with history of HTN, CAD with anterior MI 02/05/12 with occluded LAD now s/p DES x 1 LAD, ischemic cardiomyopathy with LVEF of 40% by echo 03/21/12 who is here today for cardiac follow up. He was admitted 02/05/12 with chest pain, ST elevation c/w anterior MI. Emergent cath with occluded mid LAD. His other vessels were free of obstructive disease. I placed a DES in the mid LAD. He did well but based on his systolic dysfunction, he was discharged home with a LifeVest for prevention of sudden cardiac death due to arrythmias. His echo 03/21/12 showed improvement in LVEF at 40%. He was seen here May 2015 and his BP was elevated. He was started on Norvasc but had headache and visual changes. Norvasc was stopped and HCTZ was started. He then reported low blood pressure at home so HCTZ was stopped. After a series of phone calls over the last few months, he is now on Norvasc, HCTZ, Cozaar and Coreg and has been stable. Carotid dopplers 03/21/14 with mild bilateral disease.   He is here today for follow up. No chest pain, SOB, palpitations. He has not started taking the Norvasc yet.   Primary Care Physician: Lona Kettle   Last Lipid Profile:Lipid Panel     Component Value Date/Time   CHOL 108 01/31/2013 0736   TRIG 29.0 01/31/2013 0736   HDL 51.50 01/31/2013 0736   CHOLHDL 2 01/31/2013 0736   VLDL 5.8 01/31/2013 0736   LDLCALC 51 01/31/2013 0736     Past Medical History  Diagnosis Date  . Hypertension   . CAD (coronary artery disease)     a. 01/2012 anterior STEMI PTCA/DES x 1 mid LAD  . Ischemic cardiomyopathy     a. 02/06/12 Echo: EF 30-35% s/p LifeVest placement;   b. 03/21/2012 Echo: EF 35-40%, antlat, infsept HK, dist antersept and lat AK, Gr1 DD, lifevest was d/c'd  . Hyperlipidemia     No past surgical history on file.  Current Outpatient Prescriptions  Medication Sig Dispense Refill  . amLODipine (NORVASC) 5 MG tablet Take 1 tablet (5 mg total) by  mouth daily. 90 tablet 3  . atorvastatin (LIPITOR) 80 MG tablet TAKE 1 TABLET BY MOUTH EVERY DAY 90 tablet 3  . carvedilol (COREG) 6.25 MG tablet TAKE 1 TABLET BY MOUTH TWICE A DAY 180 tablet 3  . clopidogrel (PLAVIX) 75 MG tablet Take 1 tablet (75 mg total) by mouth daily. 90 tablet 3  . losartan (COZAAR) 50 MG tablet TAKE 1 TABLET TWICE A DAY 180 tablet 3  . nitroGLYCERIN (NITROSTAT) 0.4 MG SL tablet Place 1 tablet (0.4 mg total) under the tongue every 5 (five) minutes x 3 doses as needed for chest pain. 25 tablet 3  . aspirin 81 MG tablet Take 81 mg by mouth daily.    . hydrochlorothiazide (HYDRODIURIL) 12.5 MG tablet Take 1 tablet (12.5 mg total) by mouth daily. (Patient not taking: Reported on 09/30/2014) 90 tablet 1   No current facility-administered medications for this visit.    Allergies  Allergen Reactions  . Lisinopril Cough    History   Social History  . Marital Status: Divorced    Spouse Name: N/A    Number of Children: N/A  . Years of Education: N/A   Occupational History  . Not on file.   Social History Main Topics  . Smoking status: Former Research scientist (life sciences)  . Smokeless tobacco: Never Used  . Alcohol Use: No  .  Drug Use: No  . Sexual Activity: Yes    Birth Control/ Protection: None   Other Topics Concern  . Not on file   Social History Narrative    Family History  Problem Relation Age of Onset  . CAD Neg Hx     Review of Systems:  As stated in the HPI and otherwise negative.   BP 142/98 mmHg  Pulse 65  Ht 5' 11.5" (1.816 m)  Wt 191 lb (86.637 kg)  BMI 26.27 kg/m2  Physical Examination: General: Well developed, well nourished, NAD HEENT: OP clear, mucus membranes moist SKIN: warm, dry. No rashes. Neuro: No focal deficits Musculoskeletal: Muscle strength 5/5 all ext Psychiatric: Mood and affect normal Neck: No JVD, no carotid bruits, no thyromegaly, no lymphadenopathy. Lungs:Clear bilaterally, no wheezes, rhonci, crackles Cardiovascular: Regular rate  and rhythm. No murmurs, gallops or rubs. Abdomen:Soft. Bowel sounds present. Non-tender.  Extremities: No lower extremity edema. Pulses are 2 + in the bilateral DP/PT.  Assessment and Plan:   1. CAD: Stable. He is tolerating all of his medications well. No chest pain. Will continue Plavix 75 mg daily. He is off of ASA due to GI upset. Continue beta blocker and statin. He is very active.   2. Ischemic cardiomyopathy: LVEF 40%. No indication for ICD. Will continue medical management with ARB, beta blocker. His volume status is normal.   3. HTN: BP is still elevated. Will increase Norvasc to 10 mg.   4. Carotid artery disease: Mild disease by dopplers May 2015.

## 2014-10-05 ENCOUNTER — Other Ambulatory Visit: Payer: Self-pay | Admitting: Cardiovascular Disease

## 2014-10-10 ENCOUNTER — Encounter (HOSPITAL_COMMUNITY): Payer: Self-pay | Admitting: Cardiovascular Disease

## 2014-12-30 DIAGNOSIS — H5213 Myopia, bilateral: Secondary | ICD-10-CM | POA: Diagnosis not present

## 2014-12-30 DIAGNOSIS — H2513 Age-related nuclear cataract, bilateral: Secondary | ICD-10-CM | POA: Diagnosis not present

## 2014-12-30 DIAGNOSIS — H524 Presbyopia: Secondary | ICD-10-CM | POA: Diagnosis not present

## 2014-12-30 DIAGNOSIS — H25013 Cortical age-related cataract, bilateral: Secondary | ICD-10-CM | POA: Diagnosis not present

## 2014-12-30 DIAGNOSIS — H52203 Unspecified astigmatism, bilateral: Secondary | ICD-10-CM | POA: Diagnosis not present

## 2014-12-30 DIAGNOSIS — H5210 Myopia, unspecified eye: Secondary | ICD-10-CM | POA: Diagnosis not present

## 2015-01-09 DIAGNOSIS — Z23 Encounter for immunization: Secondary | ICD-10-CM | POA: Diagnosis not present

## 2015-01-09 DIAGNOSIS — L80 Vitiligo: Secondary | ICD-10-CM | POA: Diagnosis not present

## 2015-01-09 DIAGNOSIS — I2510 Atherosclerotic heart disease of native coronary artery without angina pectoris: Secondary | ICD-10-CM | POA: Diagnosis not present

## 2015-01-09 DIAGNOSIS — E780 Pure hypercholesterolemia: Secondary | ICD-10-CM | POA: Diagnosis not present

## 2015-01-09 DIAGNOSIS — Z0001 Encounter for general adult medical examination with abnormal findings: Secondary | ICD-10-CM | POA: Diagnosis not present

## 2015-01-09 DIAGNOSIS — I10 Essential (primary) hypertension: Secondary | ICD-10-CM | POA: Diagnosis not present

## 2015-01-30 DIAGNOSIS — L80 Vitiligo: Secondary | ICD-10-CM | POA: Diagnosis not present

## 2015-04-03 ENCOUNTER — Other Ambulatory Visit: Payer: Self-pay | Admitting: Cardiovascular Disease

## 2015-04-04 ENCOUNTER — Encounter: Payer: Self-pay | Admitting: *Deleted

## 2015-04-07 ENCOUNTER — Encounter: Payer: Self-pay | Admitting: Cardiovascular Disease

## 2015-04-07 ENCOUNTER — Ambulatory Visit (INDEPENDENT_AMBULATORY_CARE_PROVIDER_SITE_OTHER): Payer: Commercial Managed Care - HMO | Admitting: Cardiovascular Disease

## 2015-04-07 VITALS — BP 122/76 | HR 76 | Ht 71.5 in | Wt 187.8 lb

## 2015-04-07 DIAGNOSIS — I2510 Atherosclerotic heart disease of native coronary artery without angina pectoris: Secondary | ICD-10-CM

## 2015-04-07 DIAGNOSIS — I779 Disorder of arteries and arterioles, unspecified: Secondary | ICD-10-CM

## 2015-04-07 DIAGNOSIS — I255 Ischemic cardiomyopathy: Secondary | ICD-10-CM | POA: Diagnosis not present

## 2015-04-07 DIAGNOSIS — I10 Essential (primary) hypertension: Secondary | ICD-10-CM

## 2015-04-07 DIAGNOSIS — I739 Peripheral vascular disease, unspecified: Secondary | ICD-10-CM

## 2015-04-07 MED ORDER — AMLODIPINE BESYLATE 5 MG PO TABS: 5.0000 mg | ORAL_TABLET | Freq: Every day | ORAL | Status: DC

## 2015-04-07 MED ORDER — LOSARTAN POTASSIUM 50 MG PO TABS: 50.0000 mg | ORAL_TABLET | Freq: Every day | ORAL | Status: DC

## 2015-04-07 MED ORDER — CARVEDILOL 6.25 MG PO TABS: 6.2500 mg | ORAL_TABLET | Freq: Two times a day (BID) | ORAL | Status: DC

## 2015-04-07 MED ORDER — ATORVASTATIN CALCIUM 80 MG PO TABS: 80.0000 mg | ORAL_TABLET | Freq: Every day | ORAL | Status: AC

## 2015-04-07 NOTE — Progress Notes (Signed)
Chief Complaint  Patient presents with  . Follow-up    6 month f/u    History of Present Illness: 79 yo AAM with history of HTN, CAD with anterior MI 02/05/12 with occluded LAD now s/p DES x 1 LAD, ischemic cardiomyopathy with LVEF of 40% by echo 03/21/12 who is here today for cardiac follow up. He was admitted 02/05/12 with chest pain, ST elevation c/w anterior MI. Emergent cath with occluded mid LAD. His other vessels were free of obstructive disease. I placed a DES in the mid LAD. He did well but based on his systolic dysfunction, he was discharged home with a LifeVest for prevention of sudden cardiac death due to arrythmias. His echo 03/21/12 showed improvement in LVEF at 40%. He was seen here May 2015 and his BP was elevated. He was started on Norvasc but had headache and visual changes. Norvasc was stopped and HCTZ was started. He then reported low blood pressure at home so HCTZ was stopped. After a series of phone calls his BP medication regimen was adjusted and BP stable on Norvasc, HCTZ, Cozaar and Coreg. Carotid dopplers 03/21/14 with mild bilateral disease.   He is here today for follow up. No chest pain, SOB, palpitations. Still riding his bike almost every day.   Primary Care Physician: Lona Kettle    Past Medical History  Diagnosis Date  . Hypertension   . CAD (coronary artery disease)     a. 01/2012 anterior STEMI PTCA/DES x 1 mid LAD  . Ischemic cardiomyopathy     a. 02/06/12 Echo: EF 30-35% s/p LifeVest placement;   b. 03/21/2012 Echo: EF 35-40%, antlat, infsept HK, dist antersept and lat AK, Gr1 DD, lifevest was d/c'd  . Hyperlipidemia     Past Surgical History  Procedure Laterality Date  . Left heart cath Left 02/05/2012    Procedure: LEFT HEART CATH;  Surgeon: Burnell Blanks, MD;  Location: [facility] CATH LAB;  Service: Cardiovascular;  Laterality: Left;  . Percutaneous coronary stent intervention (pci-s) Right 02/05/2012    Procedure: PERCUTANEOUS CORONARY STENT  INTERVENTION (PCI-S);  Surgeon: Burnell Blanks, MD;  Location: [facility] CATH LAB;  Service: Cardiovascular;  Laterality: Right;    Current Outpatient Prescriptions  Medication Sig Dispense Refill  . amLODipine (NORVASC) 5 MG tablet Take 1 tablet (5 mg total) by mouth daily. 90 tablet 3  . atorvastatin (LIPITOR) 80 MG tablet Take 1 tablet (80 mg total) by mouth daily. 90 tablet 3  . carvedilol (COREG) 6.25 MG tablet Take 1 tablet (6.25 mg total) by mouth 2 (two) times daily. 180 tablet 3  . clopidogrel (PLAVIX) 75 MG tablet Take 75 mg by mouth daily.    Marland Kitchen losartan (COZAAR) 50 MG tablet Take 1 tablet (50 mg total) by mouth daily. 90 tablet 3  . nitroGLYCERIN (NITROSTAT) 0.4 MG SL tablet Place 1 tablet (0.4 mg total) under the tongue every 5 (five) minutes x 3 doses as needed for chest pain. 25 tablet 3   No current facility-administered medications for this visit.    Allergies  Allergen Reactions  . Lisinopril Cough    History   Social History  . Marital Status: Divorced    Spouse Name: N/A  . Number of Children: N/A  . Years of Education: N/A   Occupational History  . Not on file.   Social History Main Topics  . Smoking status: Former Research scientist (life sciences)  . Smokeless tobacco: Never Used  . Alcohol Use: No  . Drug Use:  No  . Sexual Activity: Yes    Birth Control/ Protection: None   Other Topics Concern  . Not on file   Social History Narrative    Family History  Problem Relation Age of Onset  . Stroke Father   . Dementia Mother   . Hypertension Sister     Review of Systems:  As stated in the HPI and otherwise negative.   BP 122/76 mmHg  Pulse 76  Ht 5' 11.5" (1.816 m)  Wt 187 lb 12.8 oz (85.186 kg)  BMI 25.83 kg/m2  Physical Examination: General: Well developed, well nourished, NAD HEENT: OP clear, mucus membranes moist SKIN: warm, dry. No rashes. Neuro: No focal deficits Musculoskeletal: Muscle strength 5/5 all ext Psychiatric: Mood and affect normal Neck: No  JVD, no carotid bruits, no thyromegaly, no lymphadenopathy. Lungs:Clear bilaterally, no wheezes, rhonci, crackles Cardiovascular: Regular rate and rhythm. No murmurs, gallops or rubs. Abdomen:Soft. Bowel sounds present. Non-tender.  Extremities: No lower extremity edema. Pulses are 2 + in the bilateral DP/PT.  EKG:  EKG is not ordered today. The ekg ordered today demonstrates   Recent Labs: 08/29/2014: BUN 14; Creatinine 1.1; Potassium 4.3; Sodium 139   Lipid Panel    Component Value Date/Time   CHOL 108 01/31/2013 0736   TRIG 29.0 01/31/2013 0736   HDL 51.50 01/31/2013 0736   CHOLHDL 2 01/31/2013 0736   VLDL 5.8 01/31/2013 0736   LDLCALC 51 01/31/2013 0736     Wt Readings from Last 3 Encounters:  04/07/15 187 lb 12.8 oz (85.186 kg)  09/30/14 191 lb (86.637 kg)  03/28/14 187 lb (84.823 kg)     Other studies Reviewed: Additional studies/ records that were reviewed today include: . Review of the above records demonstrates:    Assessment and Plan:   1. CAD: Stable. He is tolerating all of his medications well. No chest pain. Will continue Plavix 75 mg daily. He is off of ASA due to GI upset. Continue beta blocker and statin. He is very active.   2. Ischemic cardiomyopathy: LVEF 40%. No indication for ICD. Will continue medical management with ARB, beta blocker. His volume status is normal.   3. HTN: BP is controlled. No changes  4. Carotid artery disease: Mild disease by dopplers May 2015.   Current medicines are reviewed at length with the patient today.  The patient does not have concerns regarding medicines.  The following changes have been made:  no change  Labs/ tests ordered today include:  No orders of the defined types were placed in this encounter.    Disposition:   FU with me in 12  months  Signed, Lauree Chandler, MD 04/07/2015 9:12 AM    [address], [place], [place]  [address] Phone: [phone]; Fax: [phone]

## 2015-04-07 NOTE — Patient Instructions (Signed)
Medication Instructions:  Your physician recommends that you continue on your current medications as directed. Please refer to the Current Medication list given to you today.   Labwork: none  Testing/Procedures: none  Follow-Up: Your physician wants you to follow-up in:  12 months.  You will receive a reminder letter in the mail two months in advance. If you don't receive a letter, please call our office to schedule the follow-up appointment.        

## 2015-05-15 DIAGNOSIS — H25012 Cortical age-related cataract, left eye: Secondary | ICD-10-CM | POA: Diagnosis not present

## 2015-05-15 DIAGNOSIS — H2513 Age-related nuclear cataract, bilateral: Secondary | ICD-10-CM | POA: Diagnosis not present

## 2015-05-15 DIAGNOSIS — H2512 Age-related nuclear cataract, left eye: Secondary | ICD-10-CM | POA: Diagnosis not present

## 2015-05-15 DIAGNOSIS — H25013 Cortical age-related cataract, bilateral: Secondary | ICD-10-CM | POA: Diagnosis not present

## 2015-06-04 DIAGNOSIS — H2512 Age-related nuclear cataract, left eye: Secondary | ICD-10-CM | POA: Diagnosis not present

## 2015-06-04 DIAGNOSIS — H25012 Cortical age-related cataract, left eye: Secondary | ICD-10-CM | POA: Diagnosis not present

## 2015-06-11 DIAGNOSIS — H2512 Age-related nuclear cataract, left eye: Secondary | ICD-10-CM | POA: Diagnosis not present

## 2015-06-11 DIAGNOSIS — H25012 Cortical age-related cataract, left eye: Secondary | ICD-10-CM | POA: Diagnosis not present

## 2015-07-02 DIAGNOSIS — T8529XA Other mechanical complication of intraocular lens, initial encounter: Secondary | ICD-10-CM | POA: Diagnosis not present

## 2015-07-24 DIAGNOSIS — C61 Malignant neoplasm of prostate: Secondary | ICD-10-CM | POA: Diagnosis not present

## 2015-07-30 DIAGNOSIS — Z23 Encounter for immunization: Secondary | ICD-10-CM | POA: Diagnosis not present

## 2015-09-01 DIAGNOSIS — Z0100 Encounter for examination of eyes and vision without abnormal findings: Secondary | ICD-10-CM | POA: Diagnosis not present

## 2015-09-23 DIAGNOSIS — Z0000 Encounter for general adult medical examination without abnormal findings: Secondary | ICD-10-CM | POA: Diagnosis not present

## 2015-09-23 DIAGNOSIS — Z6824 Body mass index (BMI) 24.0-24.9, adult: Secondary | ICD-10-CM | POA: Diagnosis not present

## 2015-09-23 DIAGNOSIS — I444 Left anterior fascicular block: Secondary | ICD-10-CM | POA: Diagnosis not present

## 2015-09-23 DIAGNOSIS — I252 Old myocardial infarction: Secondary | ICD-10-CM | POA: Diagnosis not present

## 2015-09-23 DIAGNOSIS — E782 Mixed hyperlipidemia: Secondary | ICD-10-CM | POA: Diagnosis not present

## 2015-09-23 DIAGNOSIS — I10 Essential (primary) hypertension: Secondary | ICD-10-CM | POA: Diagnosis not present

## 2015-09-23 DIAGNOSIS — I4510 Unspecified right bundle-branch block: Secondary | ICD-10-CM | POA: Diagnosis not present

## 2015-09-23 DIAGNOSIS — I25119 Atherosclerotic heart disease of native coronary artery with unspecified angina pectoris: Secondary | ICD-10-CM | POA: Diagnosis not present

## 2015-10-29 DIAGNOSIS — I10 Essential (primary) hypertension: Secondary | ICD-10-CM | POA: Diagnosis not present

## 2015-11-24 ENCOUNTER — Other Ambulatory Visit: Payer: Self-pay | Admitting: Cardiovascular Disease

## 2015-11-24 ENCOUNTER — Other Ambulatory Visit: Payer: Self-pay | Admitting: *Deleted

## 2015-11-24 MED ORDER — CLOPIDOGREL BISULFATE 75 MG PO TABS: 75.0000 mg | ORAL_TABLET | Freq: Every day | ORAL | Status: DC

## 2016-01-15 DIAGNOSIS — Z0000 Encounter for general adult medical examination without abnormal findings: Secondary | ICD-10-CM | POA: Diagnosis not present

## 2016-01-15 DIAGNOSIS — Z125 Encounter for screening for malignant neoplasm of prostate: Secondary | ICD-10-CM | POA: Diagnosis not present

## 2016-01-15 DIAGNOSIS — E7800 Pure hypercholesterolemia, unspecified: Secondary | ICD-10-CM | POA: Diagnosis not present

## 2016-01-15 DIAGNOSIS — I10 Essential (primary) hypertension: Secondary | ICD-10-CM | POA: Diagnosis not present

## 2016-01-19 ENCOUNTER — Telehealth: Payer: Self-pay | Admitting: Cardiovascular Disease

## 2016-01-19 NOTE — Telephone Encounter (Signed)
New Message  Pt requested to speak w/ rN concerning his meds. Please call back and discuss.

## 2016-01-19 NOTE — Telephone Encounter (Signed)
Left message to call back  

## 2016-01-19 NOTE — Telephone Encounter (Signed)
Spoke with pt. He reports he saw his primary care doctor recently who told him sometimes patient's can stop Plavix after a certain time.  Pt would like to know if he could stop Plavix and take ASA instead. ASA was previously stopped due to GI upset but pt thinks he can tolerate a coated ASA. Pt reports he is feeling good, exercising and his energy level is good. BP 117/70 when checked at primary care

## 2016-01-19 NOTE — Telephone Encounter (Signed)
F/u  Pt returning RN phone call. Please call back and discuss.   

## 2016-01-20 MED ORDER — ASPIRIN EC 81 MG PO TBEC: 81.0000 mg | DELAYED_RELEASE_TABLET | Freq: Every day | ORAL | Status: DC

## 2016-01-20 NOTE — Telephone Encounter (Signed)
I would like for him to be on anti-platelet therapy with either ASA or Plavix. He has remained on Plavix given his reported intolerance to ASA. If he would like to try a coated ASA 81 mg, then he can start that and stop Plavix. Dillon Middleton

## 2016-01-20 NOTE — Telephone Encounter (Signed)
Spoke with pt and gave him information from Dr. Angelena Form. He would like to stop Plavix and start enteric coated ASA 81 mg daily. I told pt to let us know if he cannot tolerate ASA.

## 2016-01-20 NOTE — Telephone Encounter (Signed)
Left message to call back  

## 2016-03-15 ENCOUNTER — Other Ambulatory Visit: Payer: Self-pay | Admitting: Cardiovascular Disease

## 2016-03-31 ENCOUNTER — Other Ambulatory Visit: Payer: Self-pay | Admitting: Cardiovascular Disease

## 2016-04-26 ENCOUNTER — Ambulatory Visit (INDEPENDENT_AMBULATORY_CARE_PROVIDER_SITE_OTHER): Payer: Commercial Managed Care - HMO | Admitting: Cardiovascular Disease

## 2016-04-26 ENCOUNTER — Encounter: Payer: Self-pay | Admitting: Cardiovascular Disease

## 2016-04-26 VITALS — BP 134/82 | HR 60 | Ht 71.5 in | Wt 188.1 lb

## 2016-04-26 DIAGNOSIS — I10 Essential (primary) hypertension: Secondary | ICD-10-CM | POA: Diagnosis not present

## 2016-04-26 DIAGNOSIS — I739 Peripheral vascular disease, unspecified: Secondary | ICD-10-CM

## 2016-04-26 DIAGNOSIS — I2510 Atherosclerotic heart disease of native coronary artery without angina pectoris: Secondary | ICD-10-CM | POA: Diagnosis not present

## 2016-04-26 DIAGNOSIS — I779 Disorder of arteries and arterioles, unspecified: Secondary | ICD-10-CM | POA: Diagnosis not present

## 2016-04-26 DIAGNOSIS — I255 Ischemic cardiomyopathy: Secondary | ICD-10-CM

## 2016-04-26 NOTE — Patient Instructions (Signed)

## 2016-04-26 NOTE — Progress Notes (Signed)
Chief Complaint  Patient presents with  . Follow-up    CAD    History of Present Illness: 80 yo AAM with history of HTN, CAD with anterior MI 02/05/12 with occluded LAD now s/p DES x 1 LAD, ischemic cardiomyopathy who is here today for cardiac follow up. He was admitted 02/05/12 with an anterior STEMI. Emergent cardiac cath with occluded mid LAD. His other vessels were free of obstructive disease. I placed a DES in the mid LAD. He did well but based on his systolic dysfunction, he was discharged home with a LifeVest for prevention of sudden cardiac death due to arrythmias. His echo 03/21/12 showed improvement in LVEF at 40%. Carotid dopplers 03/21/14 with mild bilateral disease.   He is here today for follow up. No chest pain, SOB, palpitations. Still riding his bike almost every day.   Primary Care Physician:  Melinda Crutch, MD   Past Medical History  Diagnosis Date  . Hypertension   . CAD (coronary artery disease)     a. 01/2012 anterior STEMI PTCA/DES x 1 mid LAD  . Ischemic cardiomyopathy     a. 02/06/12 Echo: EF 30-35% s/p LifeVest placement;   b. 03/21/2012 Echo: EF 35-40%, antlat, infsept HK, dist antersept and lat AK, Gr1 DD, lifevest was d/c'd  . Hyperlipidemia     Past Surgical History  Procedure Laterality Date  . Left heart cath Left 02/05/2012    Procedure: LEFT HEART CATH;  Surgeon: Burnell Blanks, MD;  Location: [facility] CATH LAB;  Service: Cardiovascular;  Laterality: Left;  . Percutaneous coronary stent intervention (pci-s) Right 02/05/2012    Procedure: PERCUTANEOUS CORONARY STENT INTERVENTION (PCI-S);  Surgeon: Burnell Blanks, MD;  Location: [facility] CATH LAB;  Service: Cardiovascular;  Laterality: Right;    Current Outpatient Prescriptions  Medication Sig Dispense Refill  . amLODipine (NORVASC) 5 MG tablet Take 1 tablet (5 mg total) by mouth daily. 90 tablet 0  . aspirin EC 81 MG tablet Take 1 tablet (81 mg total) by mouth daily. 90 tablet 3  . atorvastatin (LIPITOR)  80 MG tablet Take 1 tablet (80 mg total) by mouth daily. 90 tablet 3  . carvedilol (COREG) 6.25 MG tablet Take 1 tablet (6.25 mg total) by mouth 2 (two) times daily. 180 tablet 0  . losartan (COZAAR) 50 MG tablet Take 1 tablet (50 mg total) by mouth daily. 90 tablet 0  . nitroGLYCERIN (NITROSTAT) 0.4 MG SL tablet Place 1 tablet (0.4 mg total) under the tongue every 5 (five) minutes x 3 doses as needed for chest pain. 25 tablet 3   No current facility-administered medications for this visit.    Allergies  Allergen Reactions  . Lisinopril Cough    Social History   Social History  . Marital Status: Divorced    Spouse Name: N/A  . Number of Children: N/A  . Years of Education: N/A   Occupational History  . Not on file.   Social History Main Topics  . Smoking status: Former Research scientist (life sciences)  . Smokeless tobacco: Never Used  . Alcohol Use: No  . Drug Use: No  . Sexual Activity: Yes    Birth Control/ Protection: None   Other Topics Concern  . Not on file   Social History Narrative    Family History  Problem Relation Age of Onset  . Stroke Father   . Dementia Mother   . Hypertension Sister     Review of Systems:  As stated in the HPI and otherwise  negative.   BP 134/82 mmHg  Pulse 60  Ht 5' 11.5" (1.816 m)  Wt 188 lb 1.9 oz (85.331 kg)  BMI 25.87 kg/m2  Physical Examination: General: Well developed, well nourished, NAD HEENT: OP clear, mucus membranes moist SKIN: warm, dry. No rashes. Neuro: No focal deficits Musculoskeletal: Muscle strength 5/5 all ext Psychiatric: Mood and affect normal Neck: No JVD, no carotid bruits, no thyromegaly, no lymphadenopathy. Lungs:Clear bilaterally, no wheezes, rhonci, crackles Cardiovascular: Regular rate and rhythm. No murmurs, gallops or rubs. Abdomen:Soft. Bowel sounds present. Non-tender.  Extremities: No lower extremity edema. Pulses are 2 + in the bilateral DP/PT.  EKG:  EKG is ordered today. The ekg ordered today demonstrates  NSR, rate 60 bpm. RBBB  Recent Labs: No results found for requested labs within last 365 days.   Lipid Panel    Component Value Date/Time   CHOL 108 01/31/2013 0736   TRIG 29.0 01/31/2013 0736   HDL 51.50 01/31/2013 0736   CHOLHDL 2 01/31/2013 0736   VLDL 5.8 01/31/2013 0736   LDLCALC 51 01/31/2013 0736     Wt Readings from Last 3 Encounters:  04/26/16 188 lb 1.9 oz (85.331 kg)  04/07/15 187 lb 12.8 oz (85.186 kg)  09/30/14 191 lb (86.637 kg)     Other studies Reviewed: Additional studies/ records that were reviewed today include: . Review of the above records demonstrates:    Assessment and Plan:   1. CAD: Stable. He is tolerating all of his medications well. No chest pain. Will continue ASA, beta blocker and statin.    2. Ischemic cardiomyopathy: LVEF 40%. Will continue medical management with ARB, beta blocker. His volume status is normal.   3. HTN: BP is controlled. No changes  4. Carotid artery disease: Mild disease by dopplers May 2015.   Current medicines are reviewed at length with the patient today.  The patient does not have concerns regarding medicines.  The following changes have been made:  no change  Labs/ tests ordered today include:   Orders Placed This Encounter  Procedures  . EKG 12-Lead    Disposition:   FU with me in 12  months  Signed, Lauree Chandler, MD 04/26/2016 9:04 AM    [facility] [address], [place], [place]  [address] Phone: [phone]; Fax: [phone]

## 2016-06-08 ENCOUNTER — Other Ambulatory Visit: Payer: Self-pay | Admitting: Cardiovascular Disease

## 2016-06-17 ENCOUNTER — Other Ambulatory Visit: Payer: Self-pay | Admitting: *Deleted

## 2016-06-17 MED ORDER — LOSARTAN POTASSIUM 50 MG PO TABS: 50.0000 mg | ORAL_TABLET | Freq: Every day | ORAL | 2 refills | Status: DC

## 2016-07-13 DIAGNOSIS — Z23 Encounter for immunization: Secondary | ICD-10-CM | POA: Diagnosis not present

## 2016-07-23 ENCOUNTER — Ambulatory Visit (INDEPENDENT_AMBULATORY_CARE_PROVIDER_SITE_OTHER): Payer: Commercial Managed Care - HMO | Admitting: Internal Medicine

## 2016-07-23 DIAGNOSIS — Z7184 Encounter for health counseling related to travel: Secondary | ICD-10-CM

## 2016-07-23 DIAGNOSIS — Z7189 Other specified counseling: Secondary | ICD-10-CM

## 2016-07-23 MED ORDER — ATOVAQUONE-PROGUANIL HCL 250-100 MG PO TABS: 1.0000 | ORAL_TABLET | Freq: Every day | ORAL | 0 refills | Status: DC

## 2016-07-23 MED ORDER — CIPROFLOXACIN HCL 500 MG PO TABS: 500.0000 mg | ORAL_TABLET | Freq: Two times a day (BID) | ORAL | 0 refills | Status: AC

## 2016-07-23 NOTE — Patient Instructions (Addendum)
[facility]                [address], [place]                   [place], [place] [address]                      Phone: [phone]                        Fax: [phone]   Planned departure date: August 09, 2016          Planned return date: 3 weeks Countries of travel: [place] and [place]   Guidelines for the Prevention & Treatment of Traveler's Diarrhea  Prevention: "Boil it, Peel it, [place] it, or Forget it"   the fewer chances -> lower risk: try to stick to food & water precautions as much as possible"   If it's "piping hot"; it is probably okay, if not, it may not be   Treatment   1) You should always take care to drink lots of fluids in order to avoid dehydration   2) You should bring medications with you in case you come down with a case of diarrhea   3) OTC = bring pepto-bismol - can take with initial abdominal symptoms;                    Imodium - can help slow down your intestinal tract, can help relief cramps                    and diarrhea, can take if no bloody diarrhea  Use ciprofloxacin if needed for traveler's diarrhea  Guidelines for the Prevention of Malaria  Avoidance:  -fewer mosquito bites = lower risk. Mosquitos can bite at night as well as daytime  -cover up (long sleeve clothing), mosquito nets, screens  -Insect repellent for your skin ( DEET containing lotion > 20%): for clothes ( permethrin spray)   2 days prior to travel in the [employer], start malarone, daily dose starting 1-2 days before entering endemic area, ending 7 days after leaving area for malaria prevention.   Immunizations received today: Yellow Fever  Future immunizations, if indicated none indicated   Prior to travel:  1) Be sure to pick up appropriate prescriptions, including medicine you take daily. Do not expect to be able to fill your prescriptions abroad.  2) Strongly consider obtaining traveler's insurance, including  emergency evacuation insurance. Most plans in the [place] do not cover participants abroad. (see below for resources)  3) Register at the appropriate [place] embassy or consulate with travel dates so they are aware of your presence in-country and for helpful advice during travel using the [employer] (STEP, GreenNylon.com.cy).  4) Leave contact information with a relative or friend.  5) Keep a Research officer, political party, credit cards in case they become lost or stolen  6) Inform your credit card company that you will be travelling abroad   During travel:  1) If you become ill and need medical advice, the [place] [employer]	[place] of the country you are traveling in general provides a list of [place] speaking doctors.  We are also available on MyChart for remote consultation if you register prior to travel. 2) Avoid motorcycles or scooters when at all possible. Traffic laws in many countries are lax and accidents  occur frequently.  3) Do not take any unnecessary risks that you wouldn't do at home.   Resources:  -Country specific information: BlindResource.ca or GreenNylon.com.cy  -Press photographer (DEET, mosquito nets): [employer], [employer] store, [employer], [place] insurance options: gatewayplans.com; http://clayton-rivera.info/; travelguard.com or [employer], gninsurance.com or info@gninsurance .com, [id number].   Post Travel:  If you return from your trip ill, call your primary care doctor or our travel clinic @ [phone].   Enjoy your trip and know that with proper pre-travel preparation, most people have an enjoyable and uninterrupted trip!

## 2016-07-23 NOTE — Progress Notes (Signed)
Patient offered, declined the Yellow Fever immunization at this time due to risk of side effects. Patient counseled by Dr. Linus Salmons. He will call with questions after checking with his travel agency regarding the need for this vaccine. Landis Gandy, RN

## 2016-07-26 DIAGNOSIS — Z7184 Encounter for health counseling related to travel: Secondary | ICD-10-CM | POA: Insufficient documentation

## 2016-07-26 NOTE — Progress Notes (Signed)
Patient ID: Dillon Middleton, male   DOB: [birth year], 80 y.o.   MRN: [id number] Subjective:   Dillon Middleton is a 80 y.o. male who presents to the Infectious Disease clinic for travel consultation. Planned departure date: August 09, 2016          Planned return date: 3 weeks Countries of travel: [place] and [place] Areas in country: rural and urban   Accommodations: hotel Purpose of travel: vacation Prior travel out of [place]: yes     Objective:   Medications: reviewed    Assessment:    No contraindications to travel. none     Plan:    Issues discussed: freshwater swimming, future shots, insect-borne illnesses, malaria, motion sickness, MVA safety, rabies, safe food/water, traveler's diarrhea, website/handouts for more information, what to do if ill upon return, what to do if ill while there and Yellow Fever. Immunizations recommended: Yellow Fever and though patient refused YF vaccine after counseling of its importance. Malaria prophylaxis: malarone, daily dose starting 1-2 days before entering endemic area, ending 7 days after leaving area Traveler's diarrhea prophylaxis: ciprofloxacin. Total duration of visit: 1 Hour. Total time spent on education, counseling, coordination of care: 30 Minutes.

## 2016-11-29 DIAGNOSIS — H5213 Myopia, bilateral: Secondary | ICD-10-CM | POA: Diagnosis not present

## 2017-01-01 ENCOUNTER — Other Ambulatory Visit: Payer: Self-pay | Admitting: Physician Assistant

## 2017-01-01 ENCOUNTER — Other Ambulatory Visit: Payer: Self-pay | Admitting: Cardiovascular Disease

## 2017-01-01 MED ORDER — CARVEDILOL 6.25 MG PO TABS: 6.2500 mg | ORAL_TABLET | Freq: Two times a day (BID) | ORAL | 3 refills | Status: AC

## 2017-02-01 DIAGNOSIS — E7800 Pure hypercholesterolemia, unspecified: Secondary | ICD-10-CM | POA: Diagnosis not present

## 2017-02-01 DIAGNOSIS — Z8546 Personal history of malignant neoplasm of prostate: Secondary | ICD-10-CM | POA: Diagnosis not present

## 2017-02-01 DIAGNOSIS — I10 Essential (primary) hypertension: Secondary | ICD-10-CM | POA: Diagnosis not present

## 2017-02-03 DIAGNOSIS — Z0000 Encounter for general adult medical examination without abnormal findings: Secondary | ICD-10-CM | POA: Diagnosis not present

## 2017-02-03 DIAGNOSIS — I10 Essential (primary) hypertension: Secondary | ICD-10-CM | POA: Diagnosis not present

## 2017-02-03 DIAGNOSIS — Z8546 Personal history of malignant neoplasm of prostate: Secondary | ICD-10-CM | POA: Diagnosis not present

## 2017-02-03 DIAGNOSIS — R21 Rash and other nonspecific skin eruption: Secondary | ICD-10-CM | POA: Diagnosis not present

## 2017-02-03 DIAGNOSIS — I2510 Atherosclerotic heart disease of native coronary artery without angina pectoris: Secondary | ICD-10-CM | POA: Diagnosis not present

## 2017-06-10 ENCOUNTER — Ambulatory Visit (INDEPENDENT_AMBULATORY_CARE_PROVIDER_SITE_OTHER): Payer: Medicare HMO | Admitting: Internal Medicine

## 2017-06-10 DIAGNOSIS — Z789 Other specified health status: Secondary | ICD-10-CM | POA: Diagnosis not present

## 2017-06-10 DIAGNOSIS — Z9189 Other specified personal risk factors, not elsewhere classified: Secondary | ICD-10-CM | POA: Diagnosis not present

## 2017-06-10 DIAGNOSIS — Z7184 Encounter for health counseling related to travel: Secondary | ICD-10-CM

## 2017-06-10 DIAGNOSIS — Z23 Encounter for immunization: Secondary | ICD-10-CM

## 2017-06-10 DIAGNOSIS — Z7189 Other specified counseling: Secondary | ICD-10-CM | POA: Diagnosis not present

## 2017-06-10 MED ORDER — ATOVAQUONE-PROGUANIL HCL 250-100 MG PO TABS: 1.0000 | ORAL_TABLET | Freq: Every day | ORAL | 0 refills | Status: DC

## 2017-06-10 MED ORDER — AZITHROMYCIN 500 MG PO TABS: 500.0000 mg | ORAL_TABLET | Freq: Every day | ORAL | 0 refills | Status: AC

## 2017-06-10 NOTE — Progress Notes (Signed)
Subjective:   Dillon Middleton is a 80 y.o. male who presents to the Infectious Disease clinic for travel consultation.  Planned departure date:  Oct 30th    Planned return date: Nov 28th Countries of travel: [place], [place], [place], [place], [place], [place] Areas in country: urban and rural, tourist locations Accommodations: hotels Purpose of travel: vacation Prior travel out of [place]: yes - [place], and [place]  Itinerary: Oct 30-nov 28 5nights [place] Nov 5-10th - 5nights [place] Nov 11-nov 15 - [place], [place] Nov 16-nov 21- [place] Nov 22-nov 27 - [place]    Objective:   Medications: reviewed     Assessment:   No contraindications to travel. none    Plan:   Pre travel vaccination = will give injectable typhoid.  Malaria proph = will give malarone to start on [place] and recommend deet and premethrin for mosquito bite prevention  Traveler's diarrhea = gave rx for azithromycin to use if needed

## 2017-06-13 MED ORDER — ATOVAQUONE-PROGUANIL HCL 250-100 MG PO TABS: 1.0000 | ORAL_TABLET | Freq: Every day | ORAL | 0 refills | Status: AC

## 2017-06-13 NOTE — Addendum Note (Signed)
Addended by: Landis Gandy on: 06/13/2017 02:55 PM   Modules accepted: Orders

## 2017-07-13 DIAGNOSIS — Z23 Encounter for immunization: Secondary | ICD-10-CM | POA: Diagnosis not present

## 2017-07-27 ENCOUNTER — Encounter: Payer: Self-pay | Admitting: Cardiovascular Disease

## 2017-08-03 NOTE — Progress Notes (Signed)
Chief Complaint  Patient presents with  . Follow-up    essential hypertension    History of Present Illness: 81 yo male with history of HTN, HLD, CAD and ischemic cardiomyopathy who is here today for cardiac follow up. He was admitted to [facility] April 2013 with an anterior STEMI. Emergent cardiac cath with occluded mid LAD. His other vessels were free of obstructive disease. I placed a DES in the mid LAD. His echo 03/21/12 showed improvement in LVEF at 40%. Carotid dopplers 03/21/14 with mild bilateral disease.   He is here today for follow up. The patient denies any chest pain, dyspnea, palpitations, lower extremity edema, orthopnea, PND, dizziness, near syncope or syncope. He still riding his bike every day.    Primary Care Physician: Lawerance Cruel, MD   Past Medical History:  Diagnosis Date  . CAD (coronary artery disease)    a. 01/2012 anterior STEMI PTCA/DES x 1 mid LAD  . Hyperlipidemia   . Hypertension   . Ischemic cardiomyopathy    a. 02/06/12 Echo: EF 30-35% s/p LifeVest placement;   b. 03/21/2012 Echo: EF 35-40%, antlat, infsept HK, dist antersept and lat AK, Gr1 DD, lifevest was d/c'd    Past Surgical History:  Procedure Laterality Date  . LEFT HEART CATH Left 02/05/2012   Procedure: LEFT HEART CATH;  Surgeon: Burnell Blanks, MD;  Location: [facility] CATH LAB;  Service: Cardiovascular;  Laterality: Left;  . PERCUTANEOUS CORONARY STENT INTERVENTION (PCI-S) Right 02/05/2012   Procedure: PERCUTANEOUS CORONARY STENT INTERVENTION (PCI-S);  Surgeon: Burnell Blanks, MD;  Location: [facility] CATH LAB;  Service: Cardiovascular;  Laterality: Right;    Current Outpatient Prescriptions  Medication Sig Dispense Refill  . amLODipine (NORVASC) 5 MG tablet TAKE 1 TABLET EVERY DAY 90 tablet 3  . aspirin EC 81 MG tablet Take 1 tablet (81 mg total) by mouth daily. 90 tablet 3  . atorvastatin (LIPITOR) 80 MG tablet Take 1 tablet (80 mg total) by mouth daily. 90 tablet 3  . azithromycin  (ZITHROMAX) 500 MG tablet Take 1 tablet (500 mg total) by mouth daily. If you have 3+ loose stools/24 hr. Can stop taking if diarrhea stops 5 tablet 0  . carvedilol (COREG) 6.25 MG tablet Take 1 tablet (6.25 mg total) by mouth 2 (two) times daily. 180 tablet 3  . losartan (COZAAR) 50 MG tablet Take 1 tablet (50 mg total) by mouth daily. 90 tablet 2  . nitroGLYCERIN (NITROSTAT) 0.4 MG SL tablet Place 1 tablet (0.4 mg total) under the tongue every 5 (five) minutes x 3 doses as needed for chest pain. 25 tablet 3  . atovaquone-proguanil (MALARONE) 250-100 MG TABS tablet Take 1 tablet by mouth daily. Start on 11/5, take daily until complete (Patient not taking: Reported on 08/04/2017) 30 tablet 0  . ciprofloxacin (CIPRO) 500 MG tablet Take 1 tablet (500 mg total) by mouth 2 (two) times daily. Take 1-2 days until diarrhea resolves (Patient not taking: Reported on 08/04/2017) 6 tablet 0   No current facility-administered medications for this visit.     Allergies  Allergen Reactions  . Lisinopril Cough    Social History   Social History  . Marital status: Divorced    Spouse name: N/A  . Number of children: N/A  . Years of education: N/A   Occupational History  . Not on file.   Social History Main Topics  . Smoking status: Former Research scientist (life sciences)  . Smokeless tobacco: Never Used  . Alcohol use No  .  Drug use: No  . Sexual activity: Yes    Birth control/ protection: None   Other Topics Concern  . Not on file   Social History Narrative  . No narrative on file    Family History  Problem Relation Age of Onset  . Dementia Mother   . Stroke Father   . Hypertension Sister     Review of Systems:  As stated in the HPI and otherwise negative.   BP 130/84   Pulse 68   Ht 5' 11.5" (1.816 m)   Wt 188 lb 1.9 oz (85.3 kg)   SpO2 98%   BMI 25.87 kg/m   Physical Examination:  General: Well developed, well nourished, NAD  HEENT: OP clear, mucus membranes moist  SKIN: warm, dry. No  rashes. Neuro: No focal deficits  Musculoskeletal: Muscle strength 5/5 all ext  Psychiatric: Mood and affect normal  Neck: No JVD, no carotid bruits, no thyromegaly, no lymphadenopathy.  Lungs:Clear bilaterally, no wheezes, rhonci, crackles Cardiovascular: Regular rate and rhythm. No murmurs, gallops or rubs. Abdomen:Soft. Bowel sounds present. Non-tender.  Extremities: No lower extremity edema. Pulses are 2 + in the bilateral DP/PT.  EKG:  EKG is ordered today. The ekg ordered today demonstrates NSR, rate 60 bpm. RBBB.   Recent Labs: No results found for requested labs within last 8760 hours.   Lipid Panel    Component Value Date/Time   CHOL 108 01/31/2013 0736   TRIG 29.0 01/31/2013 0736   HDL 51.50 01/31/2013 0736   CHOLHDL 2 01/31/2013 0736   VLDL 5.8 01/31/2013 0736   LDLCALC 51 01/31/2013 0736     Wt Readings from Last 3 Encounters:  08/04/17 188 lb 1.9 oz (85.3 kg)  04/26/16 188 lb 1.9 oz (85.3 kg)  04/07/15 187 lb 12.8 oz (85.2 kg)     Other studies Reviewed: Additional studies/ records that were reviewed today include: . Review of the above records demonstrates:    Assessment and Plan:   1. CAD without angina: He is not having chest pain. He is very active. Will continue ASA, beta blocker and statin.     2. Ischemic cardiomyopathy: He is known to have mild LV systolic dysfunction following his MI, last LVEF around 40%. Continue beta blocker and ARB.   3. HTN: BP controlled. No changes.   4. Carotid artery disease: Last carotid dopplers May 2015. Repeat carotid dopplers now.   Current medicines are reviewed at length with the patient today.  The patient does not have concerns regarding medicines.  The following changes have been made:  no change  Labs/ tests ordered today include:   Orders Placed This Encounter  Procedures  . EKG 12-Lead  . ECHOCARDIOGRAM COMPLETE    Disposition:   FU with me in 12  months  Signed, Lauree Chandler,  MD 08/04/2017 11:50 AM    [facility] [address], [place], [place]  [address] Phone: [phone]; Fax: [phone]

## 2017-08-04 ENCOUNTER — Ambulatory Visit (INDEPENDENT_AMBULATORY_CARE_PROVIDER_SITE_OTHER): Payer: Medicare HMO | Admitting: Cardiovascular Disease

## 2017-08-04 ENCOUNTER — Encounter (INDEPENDENT_AMBULATORY_CARE_PROVIDER_SITE_OTHER): Payer: Self-pay

## 2017-08-04 ENCOUNTER — Encounter: Payer: Self-pay | Admitting: Cardiovascular Disease

## 2017-08-04 VITALS — BP 130/84 | HR 68 | Ht 71.5 in | Wt 188.1 lb

## 2017-08-04 DIAGNOSIS — I2510 Atherosclerotic heart disease of native coronary artery without angina pectoris: Secondary | ICD-10-CM

## 2017-08-04 DIAGNOSIS — I6523 Occlusion and stenosis of bilateral carotid arteries: Secondary | ICD-10-CM | POA: Diagnosis not present

## 2017-08-04 DIAGNOSIS — I10 Essential (primary) hypertension: Secondary | ICD-10-CM

## 2017-08-04 DIAGNOSIS — I255 Ischemic cardiomyopathy: Secondary | ICD-10-CM

## 2017-08-04 NOTE — Patient Instructions (Signed)
Medication Instructions:  Your physician recommends that you continue on your current medications as directed. Please refer to the Current Medication list given to you today.   Labwork: none  Testing/Procedures: Your physician has requested that you have an echocardiogram. Echocardiography is a painless test that uses sound waves to create images of your heart. It provides your doctor with information about the size and shape of your heart and how well your heart's chambers and valves are working. This procedure takes approximately one hour. There are no restrictions for this procedure.  Your physician has requested that you have a carotid duplex. This test is an ultrasound of the carotid arteries in your neck. It looks at blood flow through these arteries that supply the brain with blood. Allow one hour for this exam. There are no restrictions or special instructions.    Follow-Up: Your physician recommends that you schedule a follow-up appointment in: 12 months. Please call our office in about 9 months to schedule this appointment.     Any Other Special Instructions Will Be Listed Below (If Applicable).     If you need a refill on your cardiac medications before your next appointment, please call your pharmacy.

## 2017-08-17 ENCOUNTER — Other Ambulatory Visit: Payer: Self-pay

## 2017-08-17 ENCOUNTER — Ambulatory Visit (HOSPITAL_COMMUNITY)
Admission: RE | Admit: 2017-08-17 | Discharge: 2017-08-17 | Disposition: A | Discharge status: Home / Self Care | Payer: Medicare HMO | Source: Ambulatory Visit | Attending: Cardiovascular Disease | Admitting: Cardiovascular Disease

## 2017-08-17 ENCOUNTER — Ambulatory Visit (HOSPITAL_BASED_OUTPATIENT_CLINIC_OR_DEPARTMENT_OTHER): Payer: Medicare HMO

## 2017-08-17 DIAGNOSIS — I071 Rheumatic tricuspid insufficiency: Secondary | ICD-10-CM | POA: Diagnosis not present

## 2017-08-17 DIAGNOSIS — I252 Old myocardial infarction: Secondary | ICD-10-CM | POA: Insufficient documentation

## 2017-08-17 DIAGNOSIS — I6523 Occlusion and stenosis of bilateral carotid arteries: Secondary | ICD-10-CM | POA: Diagnosis not present

## 2017-08-17 DIAGNOSIS — I255 Ischemic cardiomyopathy: Secondary | ICD-10-CM

## 2017-08-17 DIAGNOSIS — E785 Hyperlipidemia, unspecified: Secondary | ICD-10-CM | POA: Insufficient documentation

## 2017-08-17 DIAGNOSIS — I2510 Atherosclerotic heart disease of native coronary artery without angina pectoris: Secondary | ICD-10-CM | POA: Insufficient documentation

## 2017-12-13 DIAGNOSIS — H2511 Age-related nuclear cataract, right eye: Secondary | ICD-10-CM | POA: Diagnosis not present

## 2017-12-13 DIAGNOSIS — H5213 Myopia, bilateral: Secondary | ICD-10-CM | POA: Diagnosis not present

## 2017-12-13 DIAGNOSIS — H524 Presbyopia: Secondary | ICD-10-CM | POA: Diagnosis not present

## 2017-12-13 DIAGNOSIS — H52203 Unspecified astigmatism, bilateral: Secondary | ICD-10-CM | POA: Diagnosis not present

## 2018-01-10 DIAGNOSIS — S61211A Laceration without foreign body of left index finger without damage to nail, initial encounter: Secondary | ICD-10-CM | POA: Diagnosis not present

## 2018-02-13 DIAGNOSIS — Z0000 Encounter for general adult medical examination without abnormal findings: Secondary | ICD-10-CM | POA: Diagnosis not present

## 2018-02-13 DIAGNOSIS — I2510 Atherosclerotic heart disease of native coronary artery without angina pectoris: Secondary | ICD-10-CM | POA: Diagnosis not present

## 2018-02-13 DIAGNOSIS — R21 Rash and other nonspecific skin eruption: Secondary | ICD-10-CM | POA: Diagnosis not present

## 2018-02-13 DIAGNOSIS — Z8546 Personal history of malignant neoplasm of prostate: Secondary | ICD-10-CM | POA: Diagnosis not present

## 2018-02-13 DIAGNOSIS — I10 Essential (primary) hypertension: Secondary | ICD-10-CM | POA: Diagnosis not present

## 2018-02-14 DIAGNOSIS — I10 Essential (primary) hypertension: Secondary | ICD-10-CM | POA: Diagnosis not present

## 2018-02-14 DIAGNOSIS — Z0000 Encounter for general adult medical examination without abnormal findings: Secondary | ICD-10-CM | POA: Diagnosis not present

## 2018-02-14 DIAGNOSIS — E7800 Pure hypercholesterolemia, unspecified: Secondary | ICD-10-CM | POA: Diagnosis not present

## 2018-02-14 DIAGNOSIS — Z8546 Personal history of malignant neoplasm of prostate: Secondary | ICD-10-CM | POA: Diagnosis not present

## 2018-02-14 DIAGNOSIS — I2510 Atherosclerotic heart disease of native coronary artery without angina pectoris: Secondary | ICD-10-CM | POA: Diagnosis not present

## 2018-03-22 ENCOUNTER — Other Ambulatory Visit: Payer: Self-pay | Admitting: *Deleted

## 2018-03-22 DIAGNOSIS — I779 Disorder of arteries and arterioles, unspecified: Secondary | ICD-10-CM

## 2018-03-22 DIAGNOSIS — I739 Peripheral vascular disease, unspecified: Secondary | ICD-10-CM

## 2018-03-22 DIAGNOSIS — I6523 Occlusion and stenosis of bilateral carotid arteries: Secondary | ICD-10-CM

## 2018-03-22 NOTE — Addendum Note (Signed)
Addended by: Thompson Grayer on: 03/22/2018 04:16 PM   Modules accepted: Orders

## 2018-06-28 DIAGNOSIS — H5213 Myopia, bilateral: Secondary | ICD-10-CM | POA: Diagnosis not present

## 2018-06-28 DIAGNOSIS — H524 Presbyopia: Secondary | ICD-10-CM | POA: Diagnosis not present

## 2018-06-28 DIAGNOSIS — H52203 Unspecified astigmatism, bilateral: Secondary | ICD-10-CM | POA: Diagnosis not present

## 2018-07-19 DIAGNOSIS — Z23 Encounter for immunization: Secondary | ICD-10-CM | POA: Diagnosis not present

## 2018-07-24 DIAGNOSIS — Z0100 Encounter for examination of eyes and vision without abnormal findings: Secondary | ICD-10-CM | POA: Diagnosis not present

## 2018-07-24 DIAGNOSIS — H52223 Regular astigmatism, bilateral: Secondary | ICD-10-CM | POA: Diagnosis not present

## 2018-09-13 ENCOUNTER — Ambulatory Visit: Payer: Medicare HMO | Admitting: Cardiovascular Disease

## 2018-09-13 ENCOUNTER — Encounter: Payer: Self-pay | Admitting: Cardiovascular Disease

## 2018-09-13 VITALS — BP 136/78 | HR 76 | Ht 71.5 in | Wt 185.0 lb

## 2018-09-13 DIAGNOSIS — I2510 Atherosclerotic heart disease of native coronary artery without angina pectoris: Secondary | ICD-10-CM | POA: Diagnosis not present

## 2018-09-13 DIAGNOSIS — I739 Peripheral vascular disease, unspecified: Secondary | ICD-10-CM

## 2018-09-13 DIAGNOSIS — I10 Essential (primary) hypertension: Secondary | ICD-10-CM | POA: Diagnosis not present

## 2018-09-13 DIAGNOSIS — I255 Ischemic cardiomyopathy: Secondary | ICD-10-CM | POA: Diagnosis not present

## 2018-09-13 DIAGNOSIS — I779 Disorder of arteries and arterioles, unspecified: Secondary | ICD-10-CM

## 2018-09-13 NOTE — Progress Notes (Signed)
Chief Complaint  Patient presents with  . Follow-up    CAD    History of Present Illness: 82 yo male with history of HTN, HLD, CAD and ischemic cardiomyopathy who is here today for cardiac follow up. He was admitted to [facility] April 2013 with an anterior STEMI. Emergent cardiac cath with occluded mid LAD. His other vessels were free of obstructive disease. I placed a DES in the mid LAD. His echo 03/21/12 showed improvement in LVEF at 40%. Carotid dopplers 03/21/14 with mild bilateral disease. Echo October 2018 with LVEF=35-40%. No significant valve disease.   He is here today for follow up. The patient denies any chest pain, dyspnea, palpitations, lower extremity edema, orthopnea, PND, dizziness, near syncope or syncope. He rode his bike 80 miles on his 82th birthday.    Primary Care Physician: Lawerance Cruel, MD   Past Medical History:  Diagnosis Date  . CAD (coronary artery disease)    a. 01/2012 anterior STEMI PTCA/DES x 1 mid LAD  . Hyperlipidemia   . Hypertension   . Ischemic cardiomyopathy    a. 02/06/12 Echo: EF 30-35% s/p LifeVest placement;   b. 03/21/2012 Echo: EF 35-40%, antlat, infsept HK, dist antersept and lat AK, Gr1 DD, lifevest was d/c'd    Past Surgical History:  Procedure Laterality Date  . LEFT HEART CATH Left 02/05/2012   Procedure: LEFT HEART CATH;  Surgeon: Burnell Blanks, MD;  Location: [facility] CATH LAB;  Service: Cardiovascular;  Laterality: Left;  . PERCUTANEOUS CORONARY STENT INTERVENTION (PCI-S) Right 02/05/2012   Procedure: PERCUTANEOUS CORONARY STENT INTERVENTION (PCI-S);  Surgeon: Burnell Blanks, MD;  Location: [facility] CATH LAB;  Service: Cardiovascular;  Laterality: Right;    Current Outpatient Medications  Medication Sig Dispense Refill  . amLODipine (NORVASC) 5 MG tablet TAKE 1 TABLET EVERY DAY 90 tablet 3  . aspirin EC 81 MG tablet Take 1 tablet (81 mg total) by mouth daily. 90 tablet 3  . atorvastatin (LIPITOR) 80 MG tablet Take 1 tablet (80  mg total) by mouth daily. 90 tablet 3  . atovaquone-proguanil (MALARONE) 250-100 MG TABS tablet Take 1 tablet by mouth daily. Start on 11/5, take daily until complete 30 tablet 0  . azithromycin (ZITHROMAX) 500 MG tablet Take 1 tablet (500 mg total) by mouth daily. If you have 3+ loose stools/24 hr. Can stop taking if diarrhea stops 5 tablet 0  . carvedilol (COREG) 6.25 MG tablet Take 1 tablet (6.25 mg total) by mouth 2 (two) times daily. 180 tablet 3  . ciprofloxacin (CIPRO) 500 MG tablet Take 1 tablet (500 mg total) by mouth 2 (two) times daily. Take 1-2 days until diarrhea resolves 6 tablet 0  . losartan (COZAAR) 50 MG tablet Take 1 tablet (50 mg total) by mouth daily. 90 tablet 2  . nitroGLYCERIN (NITROSTAT) 0.4 MG SL tablet Place 1 tablet (0.4 mg total) under the tongue every 5 (five) minutes x 3 doses as needed for chest pain. 25 tablet 3   No current facility-administered medications for this visit.     Allergies  Allergen Reactions  . Lisinopril Cough    Social History   Socioeconomic History  . Marital status: Divorced    Spouse name: Not on file  . Number of children: Not on file  . Years of education: Not on file  . Highest education level: Not on file  Occupational History  . Not on file  Social Needs  . Financial resource strain: Not on file  .  Food insecurity:    Worry: Not on file    Inability: Not on file  . Transportation needs:    Medical: Not on file    Non-medical: Not on file  Tobacco Use  . Smoking status: Former Research scientist (life sciences)  . Smokeless tobacco: Never Used  Substance and Sexual Activity  . Alcohol use: No  . Drug use: No  . Sexual activity: Yes    Birth control/protection: None  Lifestyle  . Physical activity:    Days per week: Not on file    Minutes per session: Not on file  . Stress: Not on file  Relationships  . Social connections:    Talks on phone: Not on file    Gets together: Not on file    Attends religious service: Not on file    Active  member of club or organization: Not on file    Attends meetings of clubs or organizations: Not on file    Relationship status: Not on file  . Intimate partner violence:    Fear of current or ex partner: Not on file    Emotionally abused: Not on file    Physically abused: Not on file    Forced sexual activity: Not on file  Other Topics Concern  . Not on file  Social History Narrative  . Not on file    Family History  Problem Relation Age of Onset  . Dementia Mother   . Stroke Father   . Hypertension Sister     Review of Systems:  As stated in the HPI and otherwise negative.   BP 136/78   Pulse 76   Ht 5' 11.5" (1.816 m)   Wt 185 lb (83.9 kg)   SpO2 99%   BMI 25.44 kg/m   Physical Examination:  General: Well developed, well nourished, NAD  HEENT: OP clear, mucus membranes moist  SKIN: warm, dry. No rashes. Neuro: No focal deficits  Musculoskeletal: Muscle strength 5/5 all ext  Psychiatric: Mood and affect normal  Neck: No JVD, no carotid bruits, no thyromegaly, no lymphadenopathy.  Lungs:Clear bilaterally, no wheezes, rhonci, crackles Cardiovascular: Regular rate and rhythm. No murmurs, gallops or rubs. Abdomen:Soft. Bowel sounds present. Non-tender.  Extremities: No lower extremity edema. Pulses are 2 + in the bilateral DP/PT.  Echo October 2018: Left ventricle: The cavity size was normal. Posterior wall   thickness was increased in a pattern of mild LVH. Systolic   function was moderately reduced. The estimated ejection fraction   was in the range of 35% to 40%. Hypokinesis of the anterolateral,   mid-apical anteroseptal, inferoseptal, and apical myocardium.   Doppler parameters are consistent with abnormal left ventricular   relaxation (grade 1 diastolic dysfunction). - Aortic valve: Transvalvular velocity was within the normal range.   There was no stenosis. There was trivial regurgitation. - Mitral valve: Transvalvular velocity was within the normal range.    There was no evidence for stenosis. There was trivial   regurgitation. - Left atrium: The atrium was mildly dilated. - Right ventricle: The cavity size was normal. Wall thickness was   normal. Systolic function was normal. - Tricuspid valve: There was mild regurgitation. - Pulmonary arteries: Systolic pressure was within the normal   range. PA peak pressure: 27 mm Hg (S).  EKG:  EKG is ordered today. The ekg ordered today demonstrates  NSR, rate 76 bpm. RBBB  Recent Labs: No results found for requested labs within last 8760 hours.   Lipid Panel    Component  Value Date/Time   CHOL 108 01/31/2013 0736   TRIG 29.0 01/31/2013 0736   HDL 51.50 01/31/2013 0736   CHOLHDL 2 01/31/2013 0736   VLDL 5.8 01/31/2013 0736   LDLCALC 51 01/31/2013 0736     Wt Readings from Last 3 Encounters:  09/13/18 185 lb (83.9 kg)  08/04/17 188 lb 1.9 oz (85.3 kg)  04/26/16 188 lb 1.9 oz (85.3 kg)     Other studies Reviewed: Additional studies/ records that were reviewed today include: . Review of the above records demonstrates:    Assessment and Plan:   1. CAD without angina: No chest pain. Continue ASA, statin and beta blocker.   LDL 65 in April 2019 in primary care.   2. Ischemic cardiomyopathy: He is known to have mild LV systolic dysfunction following his MI. Echo October 2018 with LVEF=35-40%. Will continue Cozaar and Coreg.    3. HTN: BP is controlled at home.   4. Carotid artery disease: Last carotid dopplers October 2018 with mild bilateral carotid disease.    Current medicines are reviewed at length with the patient today.  The patient does not have concerns regarding medicines.  The following changes have been made:  no change  Labs/ tests ordered today include:   Orders Placed This Encounter  Procedures  . EKG 12-Lead    Disposition:   FU with me in 12  months  Signed, Lauree Chandler, MD 09/13/2018 11:47 AM    [facility] [address],  [place], [place]  [address] Phone: [phone]; Fax: [phone]

## 2018-09-13 NOTE — Patient Instructions (Signed)

## 2018-11-24 DIAGNOSIS — H9190 Unspecified hearing loss, unspecified ear: Secondary | ICD-10-CM | POA: Diagnosis not present

## 2018-11-24 DIAGNOSIS — H9120 Sudden idiopathic hearing loss, unspecified ear: Secondary | ICD-10-CM | POA: Diagnosis not present

## 2018-12-05 DIAGNOSIS — M5412 Radiculopathy, cervical region: Secondary | ICD-10-CM | POA: Diagnosis not present

## 2018-12-05 DIAGNOSIS — M62830 Muscle spasm of back: Secondary | ICD-10-CM | POA: Diagnosis not present

## 2018-12-05 DIAGNOSIS — M9901 Segmental and somatic dysfunction of cervical region: Secondary | ICD-10-CM | POA: Diagnosis not present

## 2018-12-05 DIAGNOSIS — M9902 Segmental and somatic dysfunction of thoracic region: Secondary | ICD-10-CM | POA: Diagnosis not present

## 2018-12-06 DIAGNOSIS — M5412 Radiculopathy, cervical region: Secondary | ICD-10-CM | POA: Diagnosis not present

## 2018-12-06 DIAGNOSIS — M9901 Segmental and somatic dysfunction of cervical region: Secondary | ICD-10-CM | POA: Diagnosis not present

## 2018-12-06 DIAGNOSIS — M9902 Segmental and somatic dysfunction of thoracic region: Secondary | ICD-10-CM | POA: Diagnosis not present

## 2018-12-06 DIAGNOSIS — M62830 Muscle spasm of back: Secondary | ICD-10-CM | POA: Diagnosis not present

## 2018-12-11 DIAGNOSIS — M5412 Radiculopathy, cervical region: Secondary | ICD-10-CM | POA: Diagnosis not present

## 2018-12-11 DIAGNOSIS — M62830 Muscle spasm of back: Secondary | ICD-10-CM | POA: Diagnosis not present

## 2018-12-11 DIAGNOSIS — M9901 Segmental and somatic dysfunction of cervical region: Secondary | ICD-10-CM | POA: Diagnosis not present

## 2018-12-11 DIAGNOSIS — M9902 Segmental and somatic dysfunction of thoracic region: Secondary | ICD-10-CM | POA: Diagnosis not present

## 2018-12-13 DIAGNOSIS — M9902 Segmental and somatic dysfunction of thoracic region: Secondary | ICD-10-CM | POA: Diagnosis not present

## 2018-12-13 DIAGNOSIS — M62830 Muscle spasm of back: Secondary | ICD-10-CM | POA: Diagnosis not present

## 2018-12-13 DIAGNOSIS — M9901 Segmental and somatic dysfunction of cervical region: Secondary | ICD-10-CM | POA: Diagnosis not present

## 2018-12-13 DIAGNOSIS — M5412 Radiculopathy, cervical region: Secondary | ICD-10-CM | POA: Diagnosis not present

## 2018-12-14 DIAGNOSIS — H524 Presbyopia: Secondary | ICD-10-CM | POA: Diagnosis not present

## 2018-12-14 DIAGNOSIS — H5213 Myopia, bilateral: Secondary | ICD-10-CM | POA: Diagnosis not present

## 2018-12-14 DIAGNOSIS — H2511 Age-related nuclear cataract, right eye: Secondary | ICD-10-CM | POA: Diagnosis not present

## 2018-12-14 DIAGNOSIS — H52203 Unspecified astigmatism, bilateral: Secondary | ICD-10-CM | POA: Diagnosis not present

## 2018-12-18 DIAGNOSIS — M5412 Radiculopathy, cervical region: Secondary | ICD-10-CM | POA: Diagnosis not present

## 2018-12-18 DIAGNOSIS — M9902 Segmental and somatic dysfunction of thoracic region: Secondary | ICD-10-CM | POA: Diagnosis not present

## 2018-12-18 DIAGNOSIS — M9901 Segmental and somatic dysfunction of cervical region: Secondary | ICD-10-CM | POA: Diagnosis not present

## 2018-12-18 DIAGNOSIS — M62830 Muscle spasm of back: Secondary | ICD-10-CM | POA: Diagnosis not present

## 2018-12-21 DIAGNOSIS — M5412 Radiculopathy, cervical region: Secondary | ICD-10-CM | POA: Diagnosis not present

## 2018-12-21 DIAGNOSIS — M9901 Segmental and somatic dysfunction of cervical region: Secondary | ICD-10-CM | POA: Diagnosis not present

## 2018-12-21 DIAGNOSIS — M9902 Segmental and somatic dysfunction of thoracic region: Secondary | ICD-10-CM | POA: Diagnosis not present

## 2018-12-21 DIAGNOSIS — M62830 Muscle spasm of back: Secondary | ICD-10-CM | POA: Diagnosis not present

## 2018-12-25 DIAGNOSIS — M9901 Segmental and somatic dysfunction of cervical region: Secondary | ICD-10-CM | POA: Diagnosis not present

## 2018-12-25 DIAGNOSIS — M9902 Segmental and somatic dysfunction of thoracic region: Secondary | ICD-10-CM | POA: Diagnosis not present

## 2018-12-25 DIAGNOSIS — M62830 Muscle spasm of back: Secondary | ICD-10-CM | POA: Diagnosis not present

## 2018-12-25 DIAGNOSIS — M5412 Radiculopathy, cervical region: Secondary | ICD-10-CM | POA: Diagnosis not present

## 2018-12-26 DIAGNOSIS — M62830 Muscle spasm of back: Secondary | ICD-10-CM | POA: Diagnosis not present

## 2018-12-26 DIAGNOSIS — M5412 Radiculopathy, cervical region: Secondary | ICD-10-CM | POA: Diagnosis not present

## 2018-12-26 DIAGNOSIS — M9902 Segmental and somatic dysfunction of thoracic region: Secondary | ICD-10-CM | POA: Diagnosis not present

## 2018-12-26 DIAGNOSIS — M9901 Segmental and somatic dysfunction of cervical region: Secondary | ICD-10-CM | POA: Diagnosis not present

## 2018-12-27 DIAGNOSIS — M62830 Muscle spasm of back: Secondary | ICD-10-CM | POA: Diagnosis not present

## 2018-12-27 DIAGNOSIS — M5412 Radiculopathy, cervical region: Secondary | ICD-10-CM | POA: Diagnosis not present

## 2018-12-27 DIAGNOSIS — M9902 Segmental and somatic dysfunction of thoracic region: Secondary | ICD-10-CM | POA: Diagnosis not present

## 2018-12-27 DIAGNOSIS — M9901 Segmental and somatic dysfunction of cervical region: Secondary | ICD-10-CM | POA: Diagnosis not present

## 2019-01-01 DIAGNOSIS — M62830 Muscle spasm of back: Secondary | ICD-10-CM | POA: Diagnosis not present

## 2019-01-01 DIAGNOSIS — M5412 Radiculopathy, cervical region: Secondary | ICD-10-CM | POA: Diagnosis not present

## 2019-01-01 DIAGNOSIS — M9901 Segmental and somatic dysfunction of cervical region: Secondary | ICD-10-CM | POA: Diagnosis not present

## 2019-01-01 DIAGNOSIS — M9902 Segmental and somatic dysfunction of thoracic region: Secondary | ICD-10-CM | POA: Diagnosis not present

## 2019-01-02 DIAGNOSIS — M9901 Segmental and somatic dysfunction of cervical region: Secondary | ICD-10-CM | POA: Diagnosis not present

## 2019-01-02 DIAGNOSIS — M62830 Muscle spasm of back: Secondary | ICD-10-CM | POA: Diagnosis not present

## 2019-01-02 DIAGNOSIS — M9902 Segmental and somatic dysfunction of thoracic region: Secondary | ICD-10-CM | POA: Diagnosis not present

## 2019-01-02 DIAGNOSIS — M5412 Radiculopathy, cervical region: Secondary | ICD-10-CM | POA: Diagnosis not present

## 2019-01-03 DIAGNOSIS — M62830 Muscle spasm of back: Secondary | ICD-10-CM | POA: Diagnosis not present

## 2019-01-03 DIAGNOSIS — M5412 Radiculopathy, cervical region: Secondary | ICD-10-CM | POA: Diagnosis not present

## 2019-01-03 DIAGNOSIS — M9902 Segmental and somatic dysfunction of thoracic region: Secondary | ICD-10-CM | POA: Diagnosis not present

## 2019-01-03 DIAGNOSIS — M9901 Segmental and somatic dysfunction of cervical region: Secondary | ICD-10-CM | POA: Diagnosis not present

## 2019-01-09 DIAGNOSIS — M9902 Segmental and somatic dysfunction of thoracic region: Secondary | ICD-10-CM | POA: Diagnosis not present

## 2019-01-09 DIAGNOSIS — M62830 Muscle spasm of back: Secondary | ICD-10-CM | POA: Diagnosis not present

## 2019-01-09 DIAGNOSIS — M5412 Radiculopathy, cervical region: Secondary | ICD-10-CM | POA: Diagnosis not present

## 2019-01-09 DIAGNOSIS — M9901 Segmental and somatic dysfunction of cervical region: Secondary | ICD-10-CM | POA: Diagnosis not present

## 2019-03-19 ENCOUNTER — Other Ambulatory Visit: Payer: Self-pay | Admitting: Cardiovascular Disease

## 2019-03-19 DIAGNOSIS — I6523 Occlusion and stenosis of bilateral carotid arteries: Secondary | ICD-10-CM

## 2019-03-21 DIAGNOSIS — E7800 Pure hypercholesterolemia, unspecified: Secondary | ICD-10-CM | POA: Diagnosis not present

## 2019-03-21 DIAGNOSIS — Z0000 Encounter for general adult medical examination without abnormal findings: Secondary | ICD-10-CM | POA: Diagnosis not present

## 2019-03-21 DIAGNOSIS — I2510 Atherosclerotic heart disease of native coronary artery without angina pectoris: Secondary | ICD-10-CM | POA: Diagnosis not present

## 2019-03-21 DIAGNOSIS — I10 Essential (primary) hypertension: Secondary | ICD-10-CM | POA: Diagnosis not present

## 2019-03-21 DIAGNOSIS — Z8546 Personal history of malignant neoplasm of prostate: Secondary | ICD-10-CM | POA: Diagnosis not present

## 2019-03-29 DIAGNOSIS — Z0000 Encounter for general adult medical examination without abnormal findings: Secondary | ICD-10-CM | POA: Diagnosis not present

## 2019-05-26 ENCOUNTER — Emergency Department (HOSPITAL_COMMUNITY)
Admission: EM | Admit: 2019-05-26 | Discharge: 2019-05-27 | Disposition: A | Discharge status: Home / Self Care | Payer: Medicare HMO | Attending: Emergency Medicine | Admitting: Emergency Medicine

## 2019-05-26 ENCOUNTER — Other Ambulatory Visit: Payer: Self-pay

## 2019-05-26 ENCOUNTER — Encounter (HOSPITAL_COMMUNITY): Payer: Self-pay

## 2019-05-26 DIAGNOSIS — T7840XA Allergy, unspecified, initial encounter: Secondary | ICD-10-CM | POA: Diagnosis not present

## 2019-05-26 DIAGNOSIS — T63441A Toxic effect of venom of bees, accidental (unintentional), initial encounter: Secondary | ICD-10-CM | POA: Diagnosis not present

## 2019-05-26 DIAGNOSIS — Z79899 Other long term (current) drug therapy: Secondary | ICD-10-CM | POA: Insufficient documentation

## 2019-05-26 DIAGNOSIS — I2510 Atherosclerotic heart disease of native coronary artery without angina pectoris: Secondary | ICD-10-CM | POA: Insufficient documentation

## 2019-05-26 DIAGNOSIS — Z87891 Personal history of nicotine dependence: Secondary | ICD-10-CM | POA: Diagnosis not present

## 2019-05-26 DIAGNOSIS — Z7982 Long term (current) use of aspirin: Secondary | ICD-10-CM | POA: Insufficient documentation

## 2019-05-26 DIAGNOSIS — I4510 Unspecified right bundle-branch block: Secondary | ICD-10-CM | POA: Diagnosis not present

## 2019-05-26 DIAGNOSIS — I10 Essential (primary) hypertension: Secondary | ICD-10-CM | POA: Insufficient documentation

## 2019-05-26 MED ORDER — DIPHENHYDRAMINE HCL 25 MG PO CAPS
50.0000 mg | ORAL_CAPSULE | Freq: Once | ORAL | Status: AC
  Administered 2019-05-26: 50 mg via ORAL
  Filled 2019-05-26: qty 2

## 2019-05-26 MED ORDER — FAMOTIDINE 20 MG PO TABS
40.0000 mg | ORAL_TABLET | Freq: Once | ORAL | Status: AC
  Administered 2019-05-26: 40 mg via ORAL
  Filled 2019-05-26: qty 2

## 2019-05-26 MED ORDER — PREDNISONE 20 MG PO TABS
60.0000 mg | ORAL_TABLET | Freq: Once | ORAL | Status: AC
  Administered 2019-05-26: 23:00:00 60 mg via ORAL
  Filled 2019-05-26: qty 3

## 2019-05-26 NOTE — ED Provider Notes (Signed)
[facility] DEPT Provider Note   CSN: [id number] Arrival date & time: 05/26/19  2311    History   Chief Complaint Chief Complaint  Patient presents with  . Allergic Reaction    HPI Dillon Middleton is a 83 y.o. male.     83 yo M with a chief complaints of being stung by a bee.  States he got stung multiple times.  Started to have itchy rash afterwards.  Denies shortness of breath denied vomiting denied wheezing denied feeling like he may pass out.  No prior history of allergy to bees.  He did not try anything for this at home.  The history is provided by the patient.  Allergic Reaction Presenting symptoms: itching and rash   Severity:  Moderate Duration:  2 hours Prior allergic episodes:  No prior episodes Context: insect bite/sting   Relieved by:  Nothing Worsened by:  Nothing Ineffective treatments:  None tried   Past Medical History:  Diagnosis Date  . CAD (coronary artery disease)    a. 01/2012 anterior STEMI PTCA/DES x 1 mid LAD  . Hyperlipidemia   . Hypertension   . Ischemic cardiomyopathy    a. 02/06/12 Echo: EF 30-35% s/p LifeVest placement;   b. 03/21/2012 Echo: EF 35-40%, antlat, infsept HK, dist antersept and lat AK, Gr1 DD, lifevest was d/c'd    Patient Active Problem List   Diagnosis Date Noted  . Travel advice encounter 07/26/2016  . HTN (hypertension) 04/26/2012  . Hyperlipidemia 04/26/2012  . Dizziness 04/26/2012  . CAD (coronary artery disease)   . Ischemic cardiomyopathy   . Knee pain, right 07/01/2011  . Right knee DJD 07/01/2011    Past Surgical History:  Procedure Laterality Date  . LEFT HEART CATH Left 02/05/2012   Procedure: LEFT HEART CATH;  Surgeon: Burnell Blanks, MD;  Location: [facility] CATH LAB;  Service: Cardiovascular;  Laterality: Left;  . PERCUTANEOUS CORONARY STENT INTERVENTION (PCI-S) Right 02/05/2012   Procedure: PERCUTANEOUS CORONARY STENT INTERVENTION (PCI-S);  Surgeon: Burnell Blanks, MD;   Location: [facility] CATH LAB;  Service: Cardiovascular;  Laterality: Right;        Home Medications    Prior to Admission medications   Medication Sig Start Date End Date Taking? Authorizing Provider  amLODipine (NORVASC) 5 MG tablet TAKE 1 TABLET EVERY DAY 06/09/16   Burnell Blanks, MD  aspirin EC 81 MG tablet Take 1 tablet (81 mg total) by mouth daily. 01/20/16   Burnell Blanks, MD  atorvastatin (LIPITOR) 80 MG tablet Take 1 tablet (80 mg total) by mouth daily. 04/07/15   Burnell Blanks, MD  atovaquone-proguanil (MALARONE) 250-100 MG TABS tablet Take 1 tablet by mouth daily. Start on 11/5, take daily until complete 06/13/17   Carlyle Basques, MD  azithromycin (ZITHROMAX) 500 MG tablet Take 1 tablet (500 mg total) by mouth daily. If you have 3+ loose stools/24 hr. Can stop taking if diarrhea stops 06/10/17   Carlyle Basques, MD  carvedilol (COREG) 6.25 MG tablet Take 1 tablet (6.25 mg total) by mouth 2 (two) times daily. 01/01/17   Eileen Stanford, PA-C  ciprofloxacin (CIPRO) 500 MG tablet Take 1 tablet (500 mg total) by mouth 2 (two) times daily. Take 1-2 days until diarrhea resolves 07/23/16   Thayer Headings, MD  losartan (COZAAR) 50 MG tablet Take 1 tablet (50 mg total) by mouth daily. 06/17/16   Burnell Blanks, MD  nitroGLYCERIN (NITROSTAT) 0.4 MG SL tablet Place 1 tablet (0.4  mg total) under the tongue every 5 (five) minutes x 3 doses as needed for chest pain. 06/19/14 08/04/17  Burnell Blanks, MD  predniSONE (DELTASONE) 20 MG tablet 2 tabs po daily x 4 days 05/27/19   Deno Etienne, DO    Family History Family History  Problem Relation Age of Onset  . Dementia Mother   . Stroke Father   . Hypertension Sister     Social History Social History   Tobacco Use  . Smoking status: Former Research scientist (life sciences)  . Smokeless tobacco: Never Used  Substance Use Topics  . Alcohol use: No  . Drug use: No     Allergies   Lisinopril   Review of Systems Review of  Systems  Constitutional: Negative for chills and fever.  HENT: Negative for congestion and facial swelling.   Eyes: Negative for discharge and visual disturbance.  Respiratory: Negative for shortness of breath.   Cardiovascular: Negative for chest pain and palpitations.  Gastrointestinal: Negative for abdominal pain, diarrhea and vomiting.  Musculoskeletal: Negative for arthralgias and myalgias.  Skin: Positive for itching and rash. Negative for color change.  Neurological: Negative for tremors, syncope and headaches.  Psychiatric/Behavioral: Negative for confusion and dysphoric mood.     Physical Exam Updated Vital Signs BP (!) 148/99   Pulse 77   Temp 98.8 F (37.1 C) (Oral)   Resp 19   Ht 5\' 11"  (1.803 m)   Wt 82.1 kg   SpO2 95%   BMI 25.24 kg/m   Physical Exam Vitals signs and nursing note reviewed.  Constitutional:      Appearance: He is well-developed.  HENT:     Head: Normocephalic and atraumatic.     Comments: Tolerating secretions without difficulty no posterior oropharyngeal erythema or edema. Eyes:     Pupils: Pupils are equal, round, and reactive to light.  Neck:     Musculoskeletal: Normal range of motion and neck supple.     Vascular: No JVD.  Cardiovascular:     Rate and Rhythm: Normal rate and regular rhythm.     Heart sounds: No murmur. No friction rub. No gallop.   Pulmonary:     Effort: No respiratory distress.     Breath sounds: No wheezing.  Abdominal:     General: There is no distension.     Tenderness: There is no guarding or rebound.  Musculoskeletal: Normal range of motion.  Skin:    Coloration: Skin is not pale.     Findings: Rash present.     Comments: Hives noted to the legs and arms.  Neurological:     Mental Status: He is alert and oriented to person, place, and time.  Psychiatric:        Behavior: Behavior normal.      ED Treatments / Results  Labs (all labs ordered are listed, but only abnormal results are displayed) Labs  Reviewed - No data to display  EKG EKG Interpretation  Date/Time:  Saturday May 26 2019 23:27:30 EDT Ventricular Rate:  89 PR Interval:    QRS Duration: 155 QT Interval:  403 QTC Calculation: 491 R Axis:   -90 Text Interpretation:  Sinus rhythm Probable left atrial enlargement RBBB and LAFB No significant change since last tracing Confirmed by Deno Etienne [phone]) on 05/27/2019 12:01:48 AM   Radiology No results found.  Procedures Procedures (including critical care time)  Medications Ordered in ED Medications  predniSONE (DELTASONE) tablet 60 mg (60 mg Oral Given 05/26/19 2329)  diphenhydrAMINE (BENADRYL) capsule  50 mg (50 mg Oral Given 05/26/19 2329)  famotidine (PEPCID) tablet 40 mg (40 mg Oral Given 05/26/19 2329)     Initial Impression / Assessment and Plan / ED Course  I have reviewed the triage vital signs and the nursing notes.  Pertinent labs & imaging results that were available during my care of the patient were reviewed by me and considered in my medical decision making (see chart for details).        83 yo M with a chief complaints of an allergic reaction to a bee sting.  No prior allergy to bees.  Well-appearing and nontoxic.  Not hypotensive.  No wheezes.  Will give prednisone H2 blockers and reassess.  Patient is feeling much better on reassessment.  He is requesting to go home at this time.  I discussed using Benadryl around the clock and taking steroids for the next 4 days.  I was discussing with him prescribing an epinephrine injector in case the patient was stung by a bee and had a worse reaction.  Patient is a bit hesitant and agreed to discuss this with his family doctor.  12:44 AM:  I have discussed the diagnosis/risks/treatment options with the patient and believe the pt to be eligible for discharge home to follow-up with PCP. We also discussed returning to the ED immediately if new or worsening sx occur. We discussed the sx which are most concerning  (e.g., recurrent reaction, sob, vomiting, diarrhea, syncope) that necessitate immediate return. Medications administered to the patient during their visit and any new prescriptions provided to the patient are listed below.  Medications given during this visit Medications  predniSONE (DELTASONE) tablet 60 mg (60 mg Oral Given 05/26/19 2329)  diphenhydrAMINE (BENADRYL) capsule 50 mg (50 mg Oral Given 05/26/19 2329)  famotidine (PEPCID) tablet 40 mg (40 mg Oral Given 05/26/19 2329)     The patient appears reasonably screen and/or stabilized for discharge and I doubt any other medical condition or other [facility] requiring further screening, evaluation, or treatment in the ED at this time prior to discharge.    Final Clinical Impressions(s) / ED Diagnoses   Final diagnoses:  Allergic reaction, initial encounter  Bee sting, accidental or unintentional, initial encounter    ED Discharge Orders         Ordered    predniSONE (DELTASONE) 20 MG tablet     05/27/19 Springer, [place], DO 05/27/19 [phone]

## 2019-05-26 NOTE — ED Triage Notes (Signed)
Pt coming from home after getting stung by a few bees. Rash covering pt's body but no complaints of shortness of breath. Pt was cutting down a neighbor's tree when he got bit. No other known allergies besides lisinopril

## 2019-05-27 MED ORDER — PREDNISONE 20 MG PO TABS: ORAL_TABLET | ORAL | 0 refills | Status: AC

## 2019-05-27 NOTE — ED Notes (Signed)
Pt verbalized discharge instructions and follow up care. Alert and ambulatory. No iv. Wife driving him home

## 2019-05-27 NOTE — Discharge Instructions (Signed)
Take Benadryl up to 50 mg twice a day for the next couple days.  Take the steroids as prescribed.  We often recommend that you are prescribed a EpiPen.  Please discuss this with your family doctor and see if they think that you require one.

## 2019-08-09 ENCOUNTER — Other Ambulatory Visit: Payer: Self-pay

## 2019-08-09 DIAGNOSIS — Z20822 Contact with and (suspected) exposure to covid-19: Secondary | ICD-10-CM

## 2019-08-09 DIAGNOSIS — Z20828 Contact with and (suspected) exposure to other viral communicable diseases: Secondary | ICD-10-CM | POA: Diagnosis not present

## 2019-08-10 LAB — NOVEL CORONAVIRUS, NAA: SARS-CoV-2, NAA: NOT DETECTED

## 2019-09-10 DIAGNOSIS — I779 Disorder of arteries and arterioles, unspecified: Secondary | ICD-10-CM | POA: Insufficient documentation

## 2019-09-10 NOTE — Progress Notes (Signed)
Cardiology Office Note    Date:  09/12/2019   ID:  Dillon Middleton, DOB December 15, 1935, MRN [id number]  PCP:  Lawerance Cruel, MD  Cardiologist: Lauree Chandler, MD EPS: None  Chief Complaint  Patient presents with  . Follow-up    History of Present Illness:  Dillon Middleton is a 83 y.o. male with history of CAD status post anterior STEMI 2013 treated with DES to the mid LAD other vessels free of obstruction, ischemic cardiomyopathy ejection fraction 35 to 40% on echo 2018 no significant valvular disease, hypertension, HLD, mild bilateral carotid disease 2018.  Patient last saw Dr. Angelena Form 09/2018 and was doing well.  He rode his bike 80 miles on his 82th birthday.  Patient comes in for f/u. Would like to come off some meds. BP usually 117/70 at home.  Sometimes on Saturdays he does not take his a.m. meds until he gets back from his bike ride.  Occasionally misses carvedilol.  He rode 78 miles for his 83st birthday.  He rides 3-1/2 to 5 hours a day often going to [facility] back.  Denies chest pain, palpitations, dyspnea, dyspnea on exertion, dizziness or presyncope.  LDL 59 03/1999.   Past Medical History:  Diagnosis Date  . CAD (coronary artery disease)    a. 01/2012 anterior STEMI PTCA/DES x 1 mid LAD  . Hyperlipidemia   . Hypertension   . Ischemic cardiomyopathy    a. 02/06/12 Echo: EF 30-35% s/p LifeVest placement;   b. 03/21/2012 Echo: EF 35-40%, antlat, infsept HK, dist antersept and lat AK, Gr1 DD, lifevest was d/c'd    Past Surgical History:  Procedure Laterality Date  . LEFT HEART CATH Left 02/05/2012   Procedure: LEFT HEART CATH;  Surgeon: Burnell Blanks, MD;  Location: [facility] CATH LAB;  Service: Cardiovascular;  Laterality: Left;  . PERCUTANEOUS CORONARY STENT INTERVENTION (PCI-S) Right 02/05/2012   Procedure: PERCUTANEOUS CORONARY STENT INTERVENTION (PCI-S);  Surgeon: Burnell Blanks, MD;  Location: [facility] CATH LAB;  Service: Cardiovascular;  Laterality:  Right;    Current Medications: Current Meds  Medication Sig  . amLODipine (NORVASC) 5 MG tablet TAKE 1 TABLET EVERY DAY  . aspirin EC 81 MG tablet Take 1 tablet (81 mg total) by mouth daily.  Marland Kitchen atorvastatin (LIPITOR) 80 MG tablet Take 1 tablet (80 mg total) by mouth daily.  Marland Kitchen atovaquone-proguanil (MALARONE) 250-100 MG TABS tablet Take 1 tablet by mouth daily. Start on 11/5, take daily until complete  . azithromycin (ZITHROMAX) 500 MG tablet Take 1 tablet (500 mg total) by mouth daily. If you have 3+ loose stools/24 hr. Can stop taking if diarrhea stops  . carvedilol (COREG) 6.25 MG tablet Take 1 tablet (6.25 mg total) by mouth 2 (two) times daily.  . ciprofloxacin (CIPRO) 500 MG tablet Take 1 tablet (500 mg total) by mouth 2 (two) times daily. Take 1-2 days until diarrhea resolves  . losartan (COZAAR) 50 MG tablet Take 1 tablet (50 mg total) by mouth daily.  . predniSONE (DELTASONE) 20 MG tablet 2 tabs po daily x 4 days     Allergies:   Lisinopril   Social History   Socioeconomic History  . Marital status: Divorced    Spouse name: Not on file  . Number of children: Not on file  . Years of education: Not on file  . Highest education level: Not on file  Occupational History  . Not on file  Social Needs  . Financial resource strain: Not on file  .  Food insecurity    Worry: Not on file    Inability: Not on file  . Transportation needs    Medical: Not on file    Non-medical: Not on file  Tobacco Use  . Smoking status: Former Research scientist (life sciences)  . Smokeless tobacco: Never Used  Substance and Sexual Activity  . Alcohol use: No  . Drug use: No  . Sexual activity: Yes    Birth control/protection: None  Lifestyle  . Physical activity    Days per week: Not on file    Minutes per session: Not on file  . Stress: Not on file  Relationships  . Social Herbalist on phone: Not on file    Gets together: Not on file    Attends religious service: Not on file    Active member of club  or organization: Not on file    Attends meetings of clubs or organizations: Not on file    Relationship status: Not on file  Other Topics Concern  . Not on file  Social History Narrative  . Not on file     Family History:  The patient's family history includes Dementia in his mother; Hypertension in his sister; Stroke in his father.   ROS:   Please see the history of present illness.    ROS All other systems reviewed and are negative.   PHYSICAL EXAM:   VS:  BP 134/88   Pulse 81   Ht 5\' 11"  (1.803 m)   Wt 179 lb 3.2 oz (81.3 kg)   SpO2 99%   BMI 24.99 kg/m   Physical Exam  GEN: Well nourished, well developed, in no acute distress  Neck: no JVD, carotid bruits, or masses Cardiac:RRR; positive S4, 2/6 systolic murmur at the left sternal border Respiratory:  clear to auscultation bilaterally, normal work of breathing GI: soft, nontender, nondistended, + BS Ext: without cyanosis, clubbing, or edema, Good distal pulses bilaterally Neuro:  Alert and Oriented x 3 Psych: euthymic mood, full affect  Wt Readings from Last 3 Encounters:  09/12/19 179 lb 3.2 oz (81.3 kg)  05/26/19 181 lb (82.1 kg)  09/13/18 185 lb (83.9 kg)      Studies/Labs Reviewed:   EKG:  EKG is not ordered today.  Recent Labs: No results found for requested labs within last 8760 hours.   Lipid Panel    Component Value Date/Time   CHOL 108 01/31/2013 0736   TRIG 29.0 01/31/2013 0736   HDL 51.50 01/31/2013 0736   CHOLHDL 2 01/31/2013 0736   VLDL 5.8 01/31/2013 0736   LDLCALC 51 01/31/2013 0736    Additional studies/ records that were reviewed today include:  2D echo 2018Study Conclusions   - Left ventricle: The cavity size was normal. Posterior wall   thickness was increased in a pattern of mild LVH. Systolic   function was moderately reduced. The estimated ejection fraction   was in the range of 35% to 40%. Hypokinesis of the anterolateral,   mid-apical anteroseptal, inferoseptal, and apical  myocardium.   Doppler parameters are consistent with abnormal left ventricular   relaxation (grade 1 diastolic dysfunction). - Aortic valve: Transvalvular velocity was within the normal range.   There was no stenosis. There was trivial regurgitation. - Mitral valve: Transvalvular velocity was within the normal range.   There was no evidence for stenosis. There was trivial   regurgitation. - Left atrium: The atrium was mildly dilated. - Right ventricle: The cavity size was normal. Wall thickness was  normal. Systolic function was normal. - Tricuspid valve: There was mild regurgitation. - Pulmonary arteries: Systolic pressure was within the normal   range. PA peak pressure: 27 mm Hg (S).    Carotid Dopplers 2018Interpretation: Right Carotid: There is evidence in the right ICA of a 1-39% stenosis.   Left Carotid: There is evidence in the left ICA of a 1-39% stenosis.   Vertebrals:  Both vertebral arteries were patent with antegrade flow. Subclavians: Normal flow hemodynamics were seen in bilateral subclavian              arteries.   *See table(s) above for measurements and observations.   Suggest follow up study in PRN. Electronically signed by Carlyle Dolly on 08/18/2017 at 12:00:48 PM.           ASSESSMENT:    1. Coronary artery disease involving native coronary artery of native heart without angina pectoris   2. Ischemic cardiomyopathy   3. Essential hypertension   4. Mixed hyperlipidemia   5. Bilateral carotid artery stenosis      PLAN:  In order of problems listed above:  CAD status post anterior STEMI 2013 treated with DES to the mid LAD other vessels free of obstruction-no angina, exercises daily recommend he continue current treatment.  Could possibly decrease amlodipine in the future if he has side effects.  Ischemic cardiomyopathy ejection fraction 35 to 40% on echo in 2018-no CHF symptoms  Essential hypertension excellent control.  Hyperlipidemia LDL  59 03/2019 on Lipitor  Mild bilateral carotid disease on Dopplers in 2018 repeat in 2021.    Medication Adjustments/Labs and Tests Ordered: Current medicines are reviewed at length with the patient today.  Concerns regarding medicines are outlined above.  Medication changes, Labs and Tests ordered today are listed in the Patient Instructions below. Patient Instructions  Medication Instructions:  Your physician recommends that you continue on your current medications as directed. Please refer to the Current Medication list given to you today.  *If you need a refill on your cardiac medications before your next appointment, please call your pharmacy*  Lab Work: None ordered  If you have labs (blood work) drawn today and your tests are completely normal, you will receive your results only by: Marland Kitchen MyChart Message (if you have MyChart) OR . A paper copy in the mail If you have any lab test that is abnormal or we need to change your treatment, we will call you to review the results.  Testing/Procedures: None ordered  Follow-Up: At [facility], you and your health needs are our priority.  As part of our continuing mission to provide you with exceptional heart care, we have created designated Provider Care Teams.  These Care Teams include your primary Cardiologist (physician) and Advanced Practice Providers (APPs -  Physician Assistants and Nurse Practitioners) who all work together to provide you with the care you need, when you need it.  Your next appointment:   12 months  The format for your next appointment:   In Person  Provider:   You may see Lauree Chandler, MD or one of the following Advanced Practice Providers on your designated Care Team:    Melina Copa, PA-C  Ermalinda Barrios, PA-C   Other Instructions      Signed, Ermalinda Barrios, PA-C  09/12/2019 8:51 AM    [address], [place], [place]  [address] Phone: [phone]; Fax:  [phone]

## 2019-09-12 ENCOUNTER — Encounter: Payer: Self-pay | Admitting: Physician Assistant

## 2019-09-12 ENCOUNTER — Ambulatory Visit (INDEPENDENT_AMBULATORY_CARE_PROVIDER_SITE_OTHER): Payer: Medicare HMO | Admitting: Physician Assistant

## 2019-09-12 ENCOUNTER — Other Ambulatory Visit: Payer: Self-pay

## 2019-09-12 VITALS — BP 134/88 | HR 81 | Ht 71.0 in | Wt 179.2 lb

## 2019-09-12 DIAGNOSIS — I2510 Atherosclerotic heart disease of native coronary artery without angina pectoris: Secondary | ICD-10-CM

## 2019-09-12 DIAGNOSIS — E782 Mixed hyperlipidemia: Secondary | ICD-10-CM | POA: Diagnosis not present

## 2019-09-12 DIAGNOSIS — I6523 Occlusion and stenosis of bilateral carotid arteries: Secondary | ICD-10-CM

## 2019-09-12 DIAGNOSIS — I10 Essential (primary) hypertension: Secondary | ICD-10-CM

## 2019-09-12 DIAGNOSIS — I255 Ischemic cardiomyopathy: Secondary | ICD-10-CM | POA: Diagnosis not present

## 2019-09-12 NOTE — Patient Instructions (Signed)
Medication Instructions:  Your physician recommends that you continue on your current medications as directed. Please refer to the Current Medication list given to you today.  *If you need a refill on your cardiac medications before your next appointment, please call your pharmacy*  Lab Work: None ordered  If you have labs (blood work) drawn today and your tests are completely normal, you will receive your results only by: Marland Kitchen MyChart Message (if you have MyChart) OR . A paper copy in the mail If you have any lab test that is abnormal or we need to change your treatment, we will call you to review the results.  Testing/Procedures: None ordered  Follow-Up: At [facility], you and your health needs are our priority.  As part of our continuing mission to provide you with exceptional heart care, we have created designated Provider Care Teams.  These Care Teams include your primary Cardiologist (physician) and Advanced Practice Providers (APPs -  Physician Assistants and Nurse Practitioners) who all work together to provide you with the care you need, when you need it.  Your next appointment:   12 months  The format for your next appointment:   In Person  Provider:   You may see Lauree Chandler, MD or one of the following Advanced Practice Providers on your designated Care Team:    Melina Copa, PA-C  Ermalinda Barrios, PA-C   Other Instructions

## 2019-11-01 DIAGNOSIS — I10 Essential (primary) hypertension: Secondary | ICD-10-CM | POA: Diagnosis not present

## 2019-11-01 DIAGNOSIS — I2510 Atherosclerotic heart disease of native coronary artery without angina pectoris: Secondary | ICD-10-CM | POA: Diagnosis not present

## 2019-11-01 DIAGNOSIS — Z8546 Personal history of malignant neoplasm of prostate: Secondary | ICD-10-CM | POA: Diagnosis not present

## 2019-11-01 DIAGNOSIS — E7800 Pure hypercholesterolemia, unspecified: Secondary | ICD-10-CM | POA: Diagnosis not present

## 2019-11-27 ENCOUNTER — Ambulatory Visit: Payer: Medicare HMO

## 2019-12-07 ENCOUNTER — Ambulatory Visit: Payer: Medicare HMO | Attending: Internal Medicine

## 2019-12-17 DIAGNOSIS — H5213 Myopia, bilateral: Secondary | ICD-10-CM | POA: Diagnosis not present

## 2019-12-17 DIAGNOSIS — H52203 Unspecified astigmatism, bilateral: Secondary | ICD-10-CM | POA: Diagnosis not present

## 2019-12-17 DIAGNOSIS — H524 Presbyopia: Secondary | ICD-10-CM | POA: Diagnosis not present

## 2020-01-23 DIAGNOSIS — Z8546 Personal history of malignant neoplasm of prostate: Secondary | ICD-10-CM | POA: Diagnosis not present

## 2020-01-23 DIAGNOSIS — E7800 Pure hypercholesterolemia, unspecified: Secondary | ICD-10-CM | POA: Diagnosis not present

## 2020-01-23 DIAGNOSIS — I10 Essential (primary) hypertension: Secondary | ICD-10-CM | POA: Diagnosis not present

## 2020-01-23 DIAGNOSIS — I2510 Atherosclerotic heart disease of native coronary artery without angina pectoris: Secondary | ICD-10-CM | POA: Diagnosis not present

## 2020-04-03 ENCOUNTER — Other Ambulatory Visit: Payer: Self-pay | Admitting: Family Medicine

## 2020-04-03 DIAGNOSIS — I2510 Atherosclerotic heart disease of native coronary artery without angina pectoris: Secondary | ICD-10-CM | POA: Diagnosis not present

## 2020-04-03 DIAGNOSIS — E7800 Pure hypercholesterolemia, unspecified: Secondary | ICD-10-CM | POA: Diagnosis not present

## 2020-04-03 DIAGNOSIS — I10 Essential (primary) hypertension: Secondary | ICD-10-CM | POA: Diagnosis not present

## 2020-04-03 DIAGNOSIS — R198 Other specified symptoms and signs involving the digestive system and abdomen: Secondary | ICD-10-CM

## 2020-04-03 DIAGNOSIS — Z0000 Encounter for general adult medical examination without abnormal findings: Secondary | ICD-10-CM | POA: Diagnosis not present

## 2020-04-03 DIAGNOSIS — N529 Male erectile dysfunction, unspecified: Secondary | ICD-10-CM | POA: Diagnosis not present

## 2020-04-03 DIAGNOSIS — Z8546 Personal history of malignant neoplasm of prostate: Secondary | ICD-10-CM | POA: Diagnosis not present

## 2020-04-03 DIAGNOSIS — L80 Vitiligo: Secondary | ICD-10-CM | POA: Diagnosis not present

## 2020-04-10 ENCOUNTER — Other Ambulatory Visit: Payer: Medicare HMO

## 2020-04-17 ENCOUNTER — Ambulatory Visit
Admission: RE | Admit: 2020-04-17 | Discharge: 2020-04-17 | Disposition: A | Discharge status: Home / Self Care | Payer: Medicare HMO | Source: Ambulatory Visit | Attending: Family Medicine | Admitting: Family Medicine

## 2020-04-17 ENCOUNTER — Other Ambulatory Visit: Payer: Self-pay | Admitting: Family Medicine

## 2020-04-17 DIAGNOSIS — R198 Other specified symptoms and signs involving the digestive system and abdomen: Secondary | ICD-10-CM

## 2020-04-17 DIAGNOSIS — I77811 Abdominal aortic ectasia: Secondary | ICD-10-CM | POA: Diagnosis not present

## 2020-07-15 DIAGNOSIS — I10 Essential (primary) hypertension: Secondary | ICD-10-CM | POA: Diagnosis not present

## 2020-07-15 DIAGNOSIS — Z8546 Personal history of malignant neoplasm of prostate: Secondary | ICD-10-CM | POA: Diagnosis not present

## 2020-07-15 DIAGNOSIS — I2510 Atherosclerotic heart disease of native coronary artery without angina pectoris: Secondary | ICD-10-CM | POA: Diagnosis not present

## 2020-07-15 DIAGNOSIS — E7800 Pure hypercholesterolemia, unspecified: Secondary | ICD-10-CM | POA: Diagnosis not present

## 2020-07-25 DIAGNOSIS — Z23 Encounter for immunization: Secondary | ICD-10-CM | POA: Diagnosis not present

## 2020-09-22 ENCOUNTER — Ambulatory Visit: Payer: Medicare HMO | Admitting: Physician Assistant

## 2020-09-30 NOTE — Progress Notes (Signed)
Cardiology Office Note    Date:  10/01/2020   ID:  Dillon Middleton, DOB [birth year], MRN [id number]  PCP:  Lawerance Cruel, MD  Cardiologist: Lauree Chandler, MD EPS: None  Chief Complaint  Patient presents with  . Follow-up    History of Present Illness:  Dillon Middleton is a 84 y.o. male with history of CAD status post anterior STEMI 2013 treated with DES to the mid LAD other vessels free of obstruction, ischemic cardiomyopathy ejection fraction 35 to 40% on echo 2018 no significant valvular disease, hypertension, HLD, mild bilateral carotid disease 2018.   I last saw the patient 09/12/2019 at which time he rode 59 miles for his 83st birthday.  Patient comes in for yearly f/u. Rode 84 miles on 84nd birthday. Does both road and greenway riding. Denies chest pain, dyspnea, palpitations, edema. BP 120-130/80-85. Had abdominal US in June see below. No cardiac complaints    Past Medical History:  Diagnosis Date  . CAD (coronary artery disease)    a. 01/2012 anterior STEMI PTCA/DES x 1 mid LAD  . Hyperlipidemia   . Hypertension   . Ischemic cardiomyopathy    a. 02/06/12 Echo: EF 30-35% s/p LifeVest placement;   b. 03/21/2012 Echo: EF 35-40%, antlat, infsept HK, dist antersept and lat AK, Gr1 DD, lifevest was d/c'd    Past Surgical History:  Procedure Laterality Date  . LEFT HEART CATH Left 02/05/2012   Procedure: LEFT HEART CATH;  Surgeon: Burnell Blanks, MD;  Location: [facility] CATH LAB;  Service: Cardiovascular;  Laterality: Left;  . PERCUTANEOUS CORONARY STENT INTERVENTION (PCI-S) Right 02/05/2012   Procedure: PERCUTANEOUS CORONARY STENT INTERVENTION (PCI-S);  Surgeon: Burnell Blanks, MD;  Location: [facility] CATH LAB;  Service: Cardiovascular;  Laterality: Right;    Current Medications: Current Meds  Medication Sig  . amLODipine (NORVASC) 5 MG tablet TAKE 1 TABLET EVERY DAY  . aspirin EC 81 MG tablet Take 1 tablet (81 mg total) by mouth daily.  Marland Kitchen atorvastatin  (LIPITOR) 80 MG tablet Take 1 tablet (80 mg total) by mouth daily.  Marland Kitchen atovaquone-proguanil (MALARONE) 250-100 MG TABS tablet Take 1 tablet by mouth daily. Start on 11/5, take daily until complete  . azithromycin (ZITHROMAX) 500 MG tablet Take 1 tablet (500 mg total) by mouth daily. If you have 3+ loose stools/24 hr. Can stop taking if diarrhea stops  . carvedilol (COREG) 6.25 MG tablet Take 1 tablet (6.25 mg total) by mouth 2 (two) times daily.  . ciprofloxacin (CIPRO) 500 MG tablet Take 1 tablet (500 mg total) by mouth 2 (two) times daily. Take 1-2 days until diarrhea resolves  . losartan (COZAAR) 50 MG tablet Take 1 tablet (50 mg total) by mouth daily.  . nitroGLYCERIN (NITROSTAT) 0.4 MG SL tablet Place 1 tablet (0.4 mg total) under the tongue every 5 (five) minutes x 3 doses as needed for chest pain.  . predniSONE (DELTASONE) 20 MG tablet 2 tabs po daily x 4 days     Allergies:   Lisinopril   Social History   Socioeconomic History  . Marital status: Divorced    Spouse name: Not on file  . Number of children: Not on file  . Years of education: Not on file  . Highest education level: Not on file  Occupational History  . Not on file  Tobacco Use  . Smoking status: Former Research scientist (life sciences)  . Smokeless tobacco: Never Used  Vaping Use  . Vaping Use: Never used  Substance and Sexual  Activity  . Alcohol use: No  . Drug use: No  . Sexual activity: Yes    Birth control/protection: None  Other Topics Concern  . Not on file  Social History Narrative  . Not on file   Social Determinants of Health   Financial Resource Strain:   . Difficulty of Paying Living Expenses: Not on file  Food Insecurity:   . Worried About Charity fundraiser in the Last Year: Not on file  . Ran Out of Food in the Last Year: Not on file  Transportation Needs:   . Lack of Transportation (Medical): Not on file  . Lack of Transportation (Non-Medical): Not on file  Physical Activity:   . Days of Exercise per Week: Not  on file  . Minutes of Exercise per Session: Not on file  Stress:   . Feeling of Stress : Not on file  Social Connections:   . Frequency of Communication with Friends and Family: Not on file  . Frequency of Social Gatherings with Friends and Family: Not on file  . Attends Religious Services: Not on file  . Active Member of Clubs or Organizations: Not on file  . Attends Archivist Meetings: Not on file  . Marital Status: Not on file     Family History:  The patient's family history includes Dementia in his mother; Hypertension in his sister; Stroke in his father.   ROS:   Please see the history of present illness.    ROS All other systems reviewed and are negative.   PHYSICAL EXAM:   VS:  BP 140/90   Pulse 60   Ht 5\' 11"  (1.803 m)   Wt 177 lb 9.6 oz (80.6 kg)   SpO2 97%   BMI 24.77 kg/m   Physical Exam  GEN: Thin, in no acute distress  Neck: no JVD, carotid bruits, or masses Cardiac:RRR; 2/6 diastolic murmur at the left sternal border, positive S4 Respiratory:  clear to auscultation bilaterally, normal work of breathing GI: soft, nontender, nondistended, + BS Ext: without cyanosis, clubbing, or edema, Good distal pulses bilaterally Neuro:  Alert and Oriented x 3 Psych: euthymic mood, full affect  Wt Readings from Last 3 Encounters:  10/01/20 177 lb 9.6 oz (80.6 kg)  09/12/19 179 lb 3.2 oz (81.3 kg)  05/26/19 181 lb (82.1 kg)      Studies/Labs Reviewed:   EKG:  EKG is ordered today.  The ekg ordered today demonstrates normal sinus rhythm RBBB and LAFB, unchanged  Recent Labs: No results found for requested labs within last 8760 hours.   Lipid Panel    Component Value Date/Time   CHOL 108 01/31/2013 0736   TRIG 29.0 01/31/2013 0736   HDL 51.50 01/31/2013 0736   CHOLHDL 2 01/31/2013 0736   VLDL 5.8 01/31/2013 0736   LDLCALC 51 01/31/2013 0736    Additional studies/ records that were reviewed today include:   Abdominal US 04/2020 IMPRESSION: 1.  Abdominal aortic ectasia and atherosclerotic disease. Mid abdominal aorta has a maximum diameter of 2.9 cm, distally 2.6 cm. Ectatic abdominal aorta at risk for aneurysm development. Recommend followup by ultrasound in 5 years. This recommendation follows ACR consensus guidelines: White Paper of the ACR Incidental Findings Committee II on Vascular Findings. J Am Coll Radiol 2013; 10:789-794. Aortic aneurysm NOS (ICD10-I71.9)     Electronically Signed   By: Lajean Manes M.D.   On: 04/17/2020 10:54    2D echo 2018Study Conclusions   - Left ventricle: The  cavity size was normal. Posterior wall   thickness was increased in a pattern of mild LVH. Systolic   function was moderately reduced. The estimated ejection fraction   was in the range of 35% to 40%. Hypokinesis of the anterolateral,   mid-apical anteroseptal, inferoseptal, and apical myocardium.   Doppler parameters are consistent with abnormal left ventricular   relaxation (grade 1 diastolic dysfunction). - Aortic valve: Transvalvular velocity was within the normal range.   There was no stenosis. There was trivial regurgitation. - Mitral valve: Transvalvular velocity was within the normal range.   There was no evidence for stenosis. There was trivial   regurgitation. - Left atrium: The atrium was mildly dilated. - Right ventricle: The cavity size was normal. Wall thickness was   normal. Systolic function was normal. - Tricuspid valve: There was mild regurgitation. - Pulmonary arteries: Systolic pressure was within the normal   range. PA peak pressure: 27 mm Hg (S).     Carotid Dopplers 2018Interpretation: Right Carotid: There is evidence in the right ICA of a 1-39% stenosis.   Left Carotid: There is evidence in the left ICA of a 1-39% stenosis.   Vertebrals:  Both vertebral arteries were patent with antegrade flow. Subclavians: Normal flow hemodynamics were seen in bilateral subclavian              arteries.   *See  table(s) above for measurements and observations.   Suggest follow up study in PRN. Electronically signed by Carlyle Dolly on 08/18/2017 at 12:00:48 PM.             ASSESSMENT:    1. Coronary artery disease involving native coronary artery of native heart without angina pectoris   2. Ischemic cardiomyopathy   3. Essential hypertension   4. Hyperlipidemia, unspecified hyperlipidemia type   5. Bilateral carotid artery stenosis      PLAN:  In order of problems listed above:  CAD status post anterior STEMI 2013 treated with DES to the mid LAD other vessels free of obstruction. No angina, exercises everyday. Continue ASA  Ischemic cardiomyopathy ejection fraction 35 to 40% on echo in 2018. No CHF on exam  Essential hypertension running high today but normal at home. He will continue to monitor and call if it stays up  Hyperlipidemia-check labs today  Mild bilateral carotid disease on Dopplers in 2018.  We will plan to repeat Dopplers.     Medication Adjustments/Labs and Tests Ordered: Current medicines are reviewed at length with the patient today.  Concerns regarding medicines are outlined above.  Medication changes, Labs and Tests ordered today are listed in the Patient Instructions below. Patient Instructions  Medication Instructions:  Your physician recommends that you continue on your current medications as directed. Please refer to the Current Medication list given to you today.  *If you need a refill on your cardiac medications before your next appointment, please call your pharmacy*   Lab Work: TODAY: CMET, CBC, FLP  If you have labs (blood work) drawn today and your tests are completely normal, you will receive your results only by: Marland Kitchen MyChart Message (if you have MyChart) OR . A paper copy in the mail If you have any lab test that is abnormal or we need to change your treatment, we will call you to review the results.   Testing/Procedures: Your physician  has requested that you have a carotid duplex. This test is an ultrasound of the carotid arteries in your neck. It looks at blood flow through these  arteries that supply the brain with blood. Allow one hour for this exam. There are no restrictions or special instructions.  Follow-Up: At [facility], you and your health needs are our priority.  As part of our continuing mission to provide you with exceptional heart care, we have created designated Provider Care Teams.  These Care Teams include your primary Cardiologist (physician) and Advanced Practice Providers (APPs -  Physician Assistants and Nurse Practitioners) who all work together to provide you with the care you need, when you need it.  We recommend signing up for the patient portal called "MyChart".  Sign up information is provided on this After Visit Summary.  MyChart is used to connect with patients for Virtual Visits (Telemedicine).  Patients are able to view lab/test results, encounter notes, upcoming appointments, etc.  Non-urgent messages can be sent to your provider as well.   To learn more about what you can do with MyChart, go to [email or web address].    Your next appointment:   1 year(s)  The format for your next appointment:   In Person  Provider:   You may see Lauree Chandler, MD or one of the following Advanced Practice Providers on your designated Care Team:    Melina Copa, PA-C  Ermalinda Barrios, PA-C    Other Instructions Check your blood pressure regularly.     Sumner Boast, PA-C  10/01/2020 12:40 PM    [facility] [place], [place], [place]  [address] Phone: [phone]; Fax: [phone]

## 2020-10-01 ENCOUNTER — Other Ambulatory Visit: Payer: Self-pay

## 2020-10-01 ENCOUNTER — Ambulatory Visit: Payer: Medicare HMO | Admitting: Physician Assistant

## 2020-10-01 ENCOUNTER — Telehealth: Payer: Self-pay

## 2020-10-01 ENCOUNTER — Encounter: Payer: Self-pay | Admitting: Physician Assistant

## 2020-10-01 VITALS — BP 140/90 | HR 60 | Ht 71.0 in | Wt 177.6 lb

## 2020-10-01 DIAGNOSIS — I255 Ischemic cardiomyopathy: Secondary | ICD-10-CM | POA: Diagnosis not present

## 2020-10-01 DIAGNOSIS — E785 Hyperlipidemia, unspecified: Secondary | ICD-10-CM | POA: Diagnosis not present

## 2020-10-01 DIAGNOSIS — I6523 Occlusion and stenosis of bilateral carotid arteries: Secondary | ICD-10-CM

## 2020-10-01 DIAGNOSIS — I2510 Atherosclerotic heart disease of native coronary artery without angina pectoris: Secondary | ICD-10-CM

## 2020-10-01 DIAGNOSIS — I10 Essential (primary) hypertension: Secondary | ICD-10-CM | POA: Diagnosis not present

## 2020-10-01 NOTE — Telephone Encounter (Signed)
Pt was supposed to have labs drawn before leaving the office today. He did not have this done.  He has an appt 12/8 at [facility] office to have Carotid Doppler. I LM on his VM for him to have labs done while at that office that day.

## 2020-10-01 NOTE — Patient Instructions (Signed)
Medication Instructions:  Your physician recommends that you continue on your current medications as directed. Please refer to the Current Medication list given to you today.  *If you need a refill on your cardiac medications before your next appointment, please call your pharmacy*   Lab Work: TODAY: CMET, CBC, FLP  If you have labs (blood work) drawn today and your tests are completely normal, you will receive your results only by:  [place] (if you have MyChart) OR  A paper copy in the mail If you have any lab test that is abnormal or we need to change your treatment, we will call you to review the results.   Testing/Procedures: Your physician has requested that you have a carotid duplex. This test is an ultrasound of the carotid arteries in your neck. It looks at blood flow through these arteries that supply the brain with blood. Allow one hour for this exam. There are no restrictions or special instructions.  Follow-Up: At [facility], you and your health needs are our priority.  As part of our continuing mission to provide you with exceptional heart care, we have created designated Provider Care Teams.  These Care Teams include your primary Cardiologist (physician) and Advanced Practice Providers (APPs -  Physician Assistants and Nurse Practitioners) who all work together to provide you with the care you need, when you need it.  We recommend signing up for the patient portal called "MyChart".  Sign up information is provided on this After Visit Summary.  MyChart is used to connect with patients for Virtual Visits (Telemedicine).  Patients are able to view lab/test results, encounter notes, upcoming appointments, etc.  Non-urgent messages can be sent to your provider as well.   To learn more about what you can do with MyChart, go to [email or web address].    Your next appointment:   1 year(s)  The format for your next appointment:   In Person  Provider:   You may see  Lauree Chandler, MD or one of the following Advanced Practice Providers on your designated Care Team:    Melina Copa, PA-C  Ermalinda Barrios, PA-C    Other Instructions Check your blood pressure regularly.

## 2020-10-08 ENCOUNTER — Other Ambulatory Visit: Payer: Self-pay

## 2020-10-08 ENCOUNTER — Ambulatory Visit (HOSPITAL_COMMUNITY)
Admission: RE | Admit: 2020-10-08 | Discharge: 2020-10-08 | Disposition: A | Discharge status: Home / Self Care | Payer: Medicare HMO | Source: Ambulatory Visit | Attending: Cardiovascular Disease | Admitting: Cardiovascular Disease

## 2020-10-08 DIAGNOSIS — E785 Hyperlipidemia, unspecified: Secondary | ICD-10-CM

## 2020-10-08 DIAGNOSIS — I2510 Atherosclerotic heart disease of native coronary artery without angina pectoris: Secondary | ICD-10-CM

## 2020-10-08 DIAGNOSIS — I255 Ischemic cardiomyopathy: Secondary | ICD-10-CM

## 2020-10-08 DIAGNOSIS — I6523 Occlusion and stenosis of bilateral carotid arteries: Secondary | ICD-10-CM | POA: Diagnosis not present

## 2020-10-08 DIAGNOSIS — I10 Essential (primary) hypertension: Secondary | ICD-10-CM

## 2020-10-09 DIAGNOSIS — I6523 Occlusion and stenosis of bilateral carotid arteries: Secondary | ICD-10-CM | POA: Diagnosis not present

## 2020-10-09 DIAGNOSIS — I2510 Atherosclerotic heart disease of native coronary artery without angina pectoris: Secondary | ICD-10-CM | POA: Diagnosis not present

## 2020-10-09 DIAGNOSIS — I255 Ischemic cardiomyopathy: Secondary | ICD-10-CM | POA: Diagnosis not present

## 2020-10-09 DIAGNOSIS — I10 Essential (primary) hypertension: Secondary | ICD-10-CM | POA: Diagnosis not present

## 2020-10-09 DIAGNOSIS — E785 Hyperlipidemia, unspecified: Secondary | ICD-10-CM | POA: Diagnosis not present

## 2020-10-10 LAB — COMPREHENSIVE METABOLIC PANEL
ALT: 15 IU/L (ref 0–44)
AST: 24 IU/L (ref 0–40)
Albumin/Globulin Ratio: 1.8 (ref 1.2–2.2)
Albumin: 4.2 g/dL (ref 3.6–4.6)
Alkaline Phosphatase: 64 IU/L (ref 44–121)
BUN/Creatinine Ratio: 7 — ABNORMAL LOW (ref 10–24)
BUN: 9 mg/dL (ref 8–27)
Bilirubin Total: 0.5 mg/dL (ref 0.0–1.2)
CO2: 24 mmol/L (ref 20–29)
Calcium: 9 mg/dL (ref 8.6–10.2)
Chloride: 107 mmol/L — ABNORMAL HIGH (ref 96–106)
Creatinine, Ser: 1.3 mg/dL — ABNORMAL HIGH (ref 0.76–1.27)
GFR calc Af Amer: 59 mL/min/{1.73_m2} — ABNORMAL LOW (ref >59)
GFR calc non Af Amer: 51 mL/min/{1.73_m2} — ABNORMAL LOW (ref >59)
Globulin, Total: 2.3 g/dL (ref 1.5–4.5)
Glucose: 96 mg/dL (ref 65–99)
Potassium: 4.9 mmol/L (ref 3.5–5.2)
Sodium: 143 mmol/L (ref 134–144)
Total Protein: 6.5 g/dL (ref 6.0–8.5)

## 2020-10-10 LAB — CBC
Hematocrit: 40.5 % (ref 37.5–51.0)
Hemoglobin: 13.7 g/dL (ref 13.0–17.7)
MCH: 29.8 pg (ref 26.6–33.0)
MCHC: 33.8 g/dL (ref 31.5–35.7)
MCV: 88 fL (ref 79–97)
Platelets: 111 10*3/uL — ABNORMAL LOW (ref 150–450)
RBC: 4.59 x10E6/uL (ref 4.14–5.80)
RDW: 12.3 % (ref 11.6–15.4)
WBC: 3 10*3/uL — ABNORMAL LOW (ref 3.4–10.8)

## 2020-10-10 LAB — LIPID PANEL
Chol/HDL Ratio: 2 ratio (ref 0.0–5.0)
Cholesterol, Total: 142 mg/dL (ref 100–199)
HDL: 70 mg/dL (ref >39)
LDL Chol Calc (NIH): 64 mg/dL (ref 0–99)
Triglycerides: 29 mg/dL (ref 0–149)
VLDL Cholesterol Cal: 8 mg/dL (ref 5–40)

## 2020-10-13 ENCOUNTER — Telehealth: Payer: Self-pay

## 2020-10-13 ENCOUNTER — Other Ambulatory Visit: Payer: Self-pay

## 2020-10-13 ENCOUNTER — Telehealth: Payer: Self-pay | Admitting: Physician Assistant

## 2020-10-13 DIAGNOSIS — I2510 Atherosclerotic heart disease of native coronary artery without angina pectoris: Secondary | ICD-10-CM

## 2020-10-13 NOTE — Telephone Encounter (Signed)
Spoke to pt to give him his lab results and to drink more fluids, per APP. He has been scheduled for repeat BMET in 2 weeks and is aware of this appt. No further questions/concerns at this time.

## 2020-10-13 NOTE — Telephone Encounter (Signed)
Patient  returning a call from Friday to discuss lab results.

## 2020-10-28 ENCOUNTER — Other Ambulatory Visit: Payer: Medicare HMO

## 2020-10-28 ENCOUNTER — Other Ambulatory Visit: Payer: Self-pay

## 2020-10-28 DIAGNOSIS — I2510 Atherosclerotic heart disease of native coronary artery without angina pectoris: Secondary | ICD-10-CM

## 2020-10-28 LAB — BASIC METABOLIC PANEL
BUN/Creatinine Ratio: 11 (ref 10–24)
BUN: 14 mg/dL (ref 8–27)
CO2: 24 mmol/L (ref 20–29)
Calcium: 9.2 mg/dL (ref 8.6–10.2)
Chloride: 103 mmol/L (ref 96–106)
Creatinine, Ser: 1.24 mg/dL (ref 0.76–1.27)
GFR calc Af Amer: 62 mL/min/{1.73_m2} (ref >59)
GFR calc non Af Amer: 54 mL/min/{1.73_m2} — ABNORMAL LOW (ref >59)
Glucose: 88 mg/dL (ref 65–99)
Potassium: 4.5 mmol/L (ref 3.5–5.2)
Sodium: 138 mmol/L (ref 134–144)

## 2020-10-29 DIAGNOSIS — E7800 Pure hypercholesterolemia, unspecified: Secondary | ICD-10-CM | POA: Diagnosis not present

## 2020-10-29 DIAGNOSIS — I2510 Atherosclerotic heart disease of native coronary artery without angina pectoris: Secondary | ICD-10-CM | POA: Diagnosis not present

## 2020-10-29 DIAGNOSIS — Z8546 Personal history of malignant neoplasm of prostate: Secondary | ICD-10-CM | POA: Diagnosis not present

## 2020-10-29 DIAGNOSIS — I10 Essential (primary) hypertension: Secondary | ICD-10-CM | POA: Diagnosis not present

## 2020-12-16 DIAGNOSIS — H5213 Myopia, bilateral: Secondary | ICD-10-CM | POA: Diagnosis not present

## 2020-12-16 DIAGNOSIS — H524 Presbyopia: Secondary | ICD-10-CM | POA: Diagnosis not present

## 2020-12-16 DIAGNOSIS — Z961 Presence of intraocular lens: Secondary | ICD-10-CM | POA: Diagnosis not present

## 2020-12-16 DIAGNOSIS — H2511 Age-related nuclear cataract, right eye: Secondary | ICD-10-CM | POA: Diagnosis not present

## 2020-12-16 DIAGNOSIS — H25011 Cortical age-related cataract, right eye: Secondary | ICD-10-CM | POA: Diagnosis not present

## 2020-12-16 DIAGNOSIS — H52203 Unspecified astigmatism, bilateral: Secondary | ICD-10-CM | POA: Diagnosis not present

## 2020-12-21 DIAGNOSIS — J019 Acute sinusitis, unspecified: Secondary | ICD-10-CM | POA: Diagnosis not present

## 2020-12-21 DIAGNOSIS — R0981 Nasal congestion: Secondary | ICD-10-CM | POA: Diagnosis not present

## 2020-12-21 DIAGNOSIS — R059 Cough, unspecified: Secondary | ICD-10-CM | POA: Diagnosis not present

## 2020-12-25 DIAGNOSIS — R062 Wheezing: Secondary | ICD-10-CM | POA: Diagnosis not present

## 2020-12-25 DIAGNOSIS — J208 Acute bronchitis due to other specified organisms: Secondary | ICD-10-CM | POA: Diagnosis not present

## 2020-12-29 DIAGNOSIS — E7800 Pure hypercholesterolemia, unspecified: Secondary | ICD-10-CM | POA: Diagnosis not present

## 2020-12-29 DIAGNOSIS — Z8546 Personal history of malignant neoplasm of prostate: Secondary | ICD-10-CM | POA: Diagnosis not present

## 2020-12-29 DIAGNOSIS — I10 Essential (primary) hypertension: Secondary | ICD-10-CM | POA: Diagnosis not present

## 2020-12-29 DIAGNOSIS — I2510 Atherosclerotic heart disease of native coronary artery without angina pectoris: Secondary | ICD-10-CM | POA: Diagnosis not present

## 2021-01-02 DIAGNOSIS — M25561 Pain in right knee: Secondary | ICD-10-CM | POA: Diagnosis not present

## 2021-02-02 DIAGNOSIS — I2510 Atherosclerotic heart disease of native coronary artery without angina pectoris: Secondary | ICD-10-CM | POA: Diagnosis not present

## 2021-02-02 DIAGNOSIS — E7800 Pure hypercholesterolemia, unspecified: Secondary | ICD-10-CM | POA: Diagnosis not present

## 2021-02-02 DIAGNOSIS — Z8546 Personal history of malignant neoplasm of prostate: Secondary | ICD-10-CM | POA: Diagnosis not present

## 2021-02-02 DIAGNOSIS — I10 Essential (primary) hypertension: Secondary | ICD-10-CM | POA: Diagnosis not present

## 2021-04-10 DIAGNOSIS — R0981 Nasal congestion: Secondary | ICD-10-CM | POA: Diagnosis not present

## 2021-04-10 DIAGNOSIS — R198 Other specified symptoms and signs involving the digestive system and abdomen: Secondary | ICD-10-CM | POA: Diagnosis not present

## 2021-04-10 DIAGNOSIS — I10 Essential (primary) hypertension: Secondary | ICD-10-CM | POA: Diagnosis not present

## 2021-04-10 DIAGNOSIS — Z8546 Personal history of malignant neoplasm of prostate: Secondary | ICD-10-CM | POA: Diagnosis not present

## 2021-04-10 DIAGNOSIS — Z0000 Encounter for general adult medical examination without abnormal findings: Secondary | ICD-10-CM | POA: Diagnosis not present

## 2021-04-10 DIAGNOSIS — I2510 Atherosclerotic heart disease of native coronary artery without angina pectoris: Secondary | ICD-10-CM | POA: Diagnosis not present

## 2021-04-10 DIAGNOSIS — N529 Male erectile dysfunction, unspecified: Secondary | ICD-10-CM | POA: Diagnosis not present

## 2021-04-10 DIAGNOSIS — E7800 Pure hypercholesterolemia, unspecified: Secondary | ICD-10-CM | POA: Diagnosis not present

## 2021-04-10 DIAGNOSIS — R059 Cough, unspecified: Secondary | ICD-10-CM | POA: Diagnosis not present

## 2021-04-14 DIAGNOSIS — I77819 Aortic ectasia, unspecified site: Secondary | ICD-10-CM | POA: Diagnosis not present

## 2021-04-14 DIAGNOSIS — I10 Essential (primary) hypertension: Secondary | ICD-10-CM | POA: Diagnosis not present

## 2021-04-14 DIAGNOSIS — Z8546 Personal history of malignant neoplasm of prostate: Secondary | ICD-10-CM | POA: Diagnosis not present

## 2021-04-14 DIAGNOSIS — I700 Atherosclerosis of aorta: Secondary | ICD-10-CM | POA: Diagnosis not present

## 2021-04-14 DIAGNOSIS — Z0000 Encounter for general adult medical examination without abnormal findings: Secondary | ICD-10-CM | POA: Diagnosis not present

## 2021-04-14 DIAGNOSIS — E7800 Pure hypercholesterolemia, unspecified: Secondary | ICD-10-CM | POA: Diagnosis not present

## 2021-04-14 DIAGNOSIS — N529 Male erectile dysfunction, unspecified: Secondary | ICD-10-CM | POA: Diagnosis not present

## 2021-04-14 DIAGNOSIS — L80 Vitiligo: Secondary | ICD-10-CM | POA: Diagnosis not present

## 2021-04-16 DIAGNOSIS — R0789 Other chest pain: Secondary | ICD-10-CM | POA: Diagnosis not present

## 2021-05-25 DIAGNOSIS — R0981 Nasal congestion: Secondary | ICD-10-CM | POA: Diagnosis not present

## 2021-05-25 DIAGNOSIS — U071 COVID-19: Secondary | ICD-10-CM | POA: Diagnosis not present

## 2021-05-25 DIAGNOSIS — J029 Acute pharyngitis, unspecified: Secondary | ICD-10-CM | POA: Diagnosis not present

## 2021-05-29 DIAGNOSIS — Z03818 Encounter for observation for suspected exposure to other biological agents ruled out: Secondary | ICD-10-CM | POA: Diagnosis not present

## 2021-05-29 DIAGNOSIS — Z20822 Contact with and (suspected) exposure to covid-19: Secondary | ICD-10-CM | POA: Diagnosis not present

## 2021-07-18 DIAGNOSIS — K921 Melena: Secondary | ICD-10-CM | POA: Diagnosis not present

## 2021-07-23 DIAGNOSIS — K648 Other hemorrhoids: Secondary | ICD-10-CM | POA: Diagnosis not present

## 2021-07-23 DIAGNOSIS — K625 Hemorrhage of anus and rectum: Secondary | ICD-10-CM | POA: Diagnosis not present

## 2021-08-28 DIAGNOSIS — E7800 Pure hypercholesterolemia, unspecified: Secondary | ICD-10-CM | POA: Diagnosis not present

## 2021-08-28 DIAGNOSIS — I10 Essential (primary) hypertension: Secondary | ICD-10-CM | POA: Diagnosis not present

## 2021-08-28 DIAGNOSIS — I2510 Atherosclerotic heart disease of native coronary artery without angina pectoris: Secondary | ICD-10-CM | POA: Diagnosis not present

## 2021-09-07 DIAGNOSIS — K625 Hemorrhage of anus and rectum: Secondary | ICD-10-CM | POA: Diagnosis not present

## 2021-09-10 DIAGNOSIS — K625 Hemorrhage of anus and rectum: Secondary | ICD-10-CM | POA: Diagnosis not present

## 2021-09-10 DIAGNOSIS — Z8546 Personal history of malignant neoplasm of prostate: Secondary | ICD-10-CM | POA: Diagnosis not present

## 2021-09-15 NOTE — Progress Notes (Signed)
Cardiology Office Note    Date:  09/29/2021   ID:  Dillon Middleton, DOB 1936-01-15, MRN [id number]   PCP:  Lawerance Cruel, [place]  Cardiologist:  Lauree Chandler, MD   Advanced Practice Provider:  No care team member to display Electrophysiologist:  None   [phone]   No chief complaint on file.   History of Present Illness:  Dillon Middleton is a 85 y.o. male  with history of CAD status post anterior STEMI 2013 treated with DES to the mid LAD other vessels free of obstruction, ischemic cardiomyopathy ejection fraction 35 to 40% on echo 2018 no significant valvular disease, hypertension, HLD, mild bilateral carotid disease 2018.   I last saw the patient 10/2020 and he Rode 82 miles on 84nd birthday. Does both road and greenway riding.. Had abdominal US in June 2021-ectatic aorta.   Patient comes in for f/u. BP running high 160/90. Happened after dancing this am but has been running high for a couple of weeks. Has been eating canned soups and at [employer] Road 83 miles on his 85rd bday. Had blood in his stool and scheduled for colonoscopy Dec 5      Past Medical History:  Diagnosis Date   CAD (coronary artery disease)    a. 01/2012 anterior STEMI PTCA/DES x 1 mid LAD   Hyperlipidemia    Hypertension    Ischemic cardiomyopathy    a. 02/06/12 Echo: EF 30-35% s/p LifeVest placement;   b. 03/21/2012 Echo: EF 35-40%, antlat, infsept HK, dist antersept and lat AK, Gr1 DD, lifevest was d/c'd    Past Surgical History:  Procedure Laterality Date   LEFT HEART CATH Left 02/05/2012   Procedure: LEFT HEART CATH;  Surgeon: Burnell Blanks, MD;  Location: [facility] CATH LAB;  Service: Cardiovascular;  Laterality: Left;   PERCUTANEOUS CORONARY STENT INTERVENTION (PCI-S) Right 02/05/2012   Procedure: PERCUTANEOUS CORONARY STENT INTERVENTION (PCI-S);  Surgeon: Burnell Blanks, MD;  Location: [facility] CATH LAB;  Service: Cardiovascular;   Laterality: Right;    Current Medications: Current Meds  Medication Sig   amLODipine (NORVASC) 5 MG tablet TAKE 1 TABLET EVERY DAY   aspirin EC 81 MG tablet Take 1 tablet (81 mg total) by mouth daily.   atorvastatin (LIPITOR) 80 MG tablet Take 1 tablet (80 mg total) by mouth daily.   atovaquone-proguanil (MALARONE) 250-100 MG TABS tablet Take 1 tablet by mouth daily. Start on 11/5, take daily until complete   azithromycin (ZITHROMAX) 500 MG tablet Take 1 tablet (500 mg total) by mouth daily. If you have 3+ loose stools/24 hr. Can stop taking if diarrhea stops   carvedilol (COREG) 6.25 MG tablet Take 1 tablet (6.25 mg total) by mouth 2 (two) times daily.   losartan (COZAAR) 50 MG tablet Take 1 tablet (50 mg total) by mouth daily.   nitroGLYCERIN (NITROSTAT) 0.4 MG SL tablet Place 1 tablet (0.4 mg total) under the tongue every 5 (five) minutes x 3 doses as needed for chest pain.   predniSONE (DELTASONE) 20 MG tablet 2 tabs po daily x 4 days     Allergies:   Lisinopril   Social History   Socioeconomic History   Marital status: Divorced    Spouse name: Not on file   Number of children: Not on file   Years of education: Not on file   Highest education level: Not on file  Occupational History   Not on file  Tobacco Use  Smoking status: Former   Smokeless tobacco: Never  Scientific laboratory technician Use: Never used  Substance and Sexual Activity   Alcohol use: No   Drug use: No   Sexual activity: Yes    Birth control/protection: None  Other Topics Concern   Not on file  Social History Narrative   Not on file   Social Determinants of Health   Financial Resource Strain: Not on file  Food Insecurity: Not on file  Transportation Needs: Not on file  Physical Activity: Not on file  Stress: Not on file  Social Connections: Not on file     Family History:  The patient's  family history includes Dementia in his mother; Hypertension in his sister; Stroke in his father.   ROS:   Please  see the history of present illness.    ROS All other systems reviewed and are negative.   PHYSICAL EXAM:   VS:  BP (!) 150/86   Pulse 70   Ht 5\' 11"  (1.803 m)   Wt 175 lb 3.2 oz (79.5 kg)   SpO2 95%   BMI 24.44 kg/m   Physical Exam  GEN: Thin, in no acute distress  Neck: no JVD, carotid bruits, or masses Cardiac:RRR; positive S4, 2/6 systolic murmur at the apex Respiratory:  clear to auscultation bilaterally, normal work of breathing GI: soft, nontender, nondistended, + BS Ext: without cyanosis, clubbing, or edema, Good distal pulses bilaterally Neuro:  Alert and Oriented x 3 Psych: euthymic mood, full affect  Wt Readings from Last 3 Encounters:  09/29/21 175 lb 3.2 oz (79.5 kg)  10/01/20 177 lb 9.6 oz (80.6 kg)  09/12/19 179 lb 3.2 oz (81.3 kg)      Studies/Labs Reviewed:   EKG:  EKG is  ordered today.  The ekg ordered today demonstrates normal sinus rhythm with RBBB and LAFB unchanged from prior tracings  Recent Labs: 10/09/2020: ALT 15; Hemoglobin 13.7; Platelets 111 10/28/2020: BUN 14; Creatinine, Ser 1.24; Potassium 4.5; Sodium 138   Lipid Panel    Component Value Date/Time   CHOL 142 10/09/2020 1034   TRIG 29 10/09/2020 1034   HDL 70 10/09/2020 1034   CHOLHDL 2.0 10/09/2020 1034   CHOLHDL 2 01/31/2013 0736   VLDL 5.8 01/31/2013 0736   LDLCALC 64 10/09/2020 1034    Additional studies/ records that were reviewed today include:  Carotids 10/08/20 Summary:  Right Carotid: Velocities in the right ICA are consistent with a stable  1-39%                stenosis. Non-hemodynamically significant plaque <50% noted  in                the CCA.   Left Carotid: Velocities in the left ICA are consistent with a stable  1-39%               stenosis. Non-hemodynamically significant plaque <50% noted  in the                CCA.   Vertebrals:  Bilateral vertebral arteries demonstrate antegrade flow.  Subclavians: Normal flow hemodynamics were seen in bilateral  subclavian               arteries.   *See table(s) above for measurements and observations.      Risk Assessment/Calculations:         ASSESSMENT:    1. Coronary artery disease involving native coronary artery of native heart without angina pectoris  2. Ischemic cardiomyopathy   3. Essential hypertension   4. Hyperlipidemia, unspecified hyperlipidemia type   5. Bilateral carotid artery stenosis      PLAN:  In order of problems listed above:  CAD status post anterior STEMI 2013 treated with DES to the mid LAD other vessels free of obstruction. No angina, exercises everyday. Continue ASA   Ischemic cardiomyopathy ejection fraction 35 to 40% on echo in 2018. No CHF on exam but does have a murmur BP running high will repeat echo   Essential hypertension BP running high but has been getting extra salt in his diet to canned soups and eating out.  He would like to adjust his diet before increasing any medications.  He will let us know in 2 weeks if it continues to run high.  If so would increase losartan to 75 mg daily.  His heart rate is in the 50s at home on carvedilol.   Hyperlipidemia-LDL 56 04/2021 on lipitor   Mild bilateral carotid disease on Dopplers in 2021 1-39% bilateral ICA.        Shared Decision Making/Informed Consent        Medication Adjustments/Labs and Tests Ordered: Current medicines are reviewed at length with the patient today.  Concerns regarding medicines are outlined above.  Medication changes, Labs and Tests ordered today are listed in the Patient Instructions below. Patient Instructions  Medication Instructions:  Your physician recommends that you continue on your current medications as directed. Please refer to the Current Medication list given to you today.  *If you need a refill on your cardiac medications before your next appointment, please call your pharmacy*   Lab Work: None If you have labs (blood work) drawn today and your tests are  completely normal, you will receive your results only by: [place] (if you have MyChart) OR A paper copy in the mail If you have any lab test that is abnormal or we need to change your treatment, we will call you to review the results.   Testing/Procedures: Your physician has requested that you have an echocardiogram. Echocardiography is a painless test that uses sound waves to create images of your heart. It provides your doctor with information about the size and shape of your heart and how well your heart's chambers and valves are working. This procedure takes approximately one hour. There are no restrictions for this procedure.   Follow-Up: At [facility], you and your health needs are our priority.  As part of our continuing mission to provide you with exceptional heart care, we have created designated Provider Care Teams.  These Care Teams include your primary Cardiologist (physician) and Advanced Practice Providers (APPs -  Physician Assistants and Nurse Practitioners) who all work together to provide you with the care you need, when you need it.   Your next appointment:   1 year(s)  The format for your next appointment:   In Person  Provider:   Lauree Chandler, MD   Other Instructions Keep a record of Blood Pressure readings and send to [place] in 2 weeks.  Two Gram Sodium Diet 2000 mg  What is Sodium? Sodium is a mineral found naturally in many foods. The most significant source of sodium in the diet is table salt, which is about 40% sodium.  Processed, convenience, and preserved foods also contain a large amount of sodium.  The body needs only 500 mg of sodium daily to function,  A normal diet provides more than enough sodium even if you  do not use salt.  Why Limit Sodium? A build up of sodium in the body can cause thirst, increased blood pressure, shortness of breath, and water retention.  Decreasing sodium in the diet can reduce edema and risk of heart attack or  stroke associated with high blood pressure.  Keep in mind that there are many other factors involved in these health problems.  Heredity, obesity, lack of exercise, cigarette smoking, stress and what you eat all play a role.  General Guidelines: Do not add salt at the table or in cooking.  One teaspoon of salt contains over 2 grams of sodium. Read food labels Avoid processed and convenience foods Ask your dietitian before eating any foods not dicussed in the menu planning guidelines Consult your physician if you wish to use a salt substitute or a sodium containing medication such as antacids.  Limit milk and milk products to 16 oz (2 cups) per day.  Shopping Hints: READ LABELS!! "Dietetic" does not necessarily mean low sodium. Salt and other sodium ingredients are often added to foods during processing.    Menu Planning Guidelines Food Group Choose More Often Avoid  Beverages (see also the milk group All fruit juices, low-sodium, salt-free vegetables juices, low-sodium carbonated beverages Regular vegetable or tomato juices, commercially softened water used for drinking or cooking  Breads and Cereals Enriched white, wheat, rye and pumpernickel bread, hard rolls and dinner rolls; muffins, cornbread and waffles; most dry cereals, cooked cereal without added salt; unsalted crackers and breadsticks; low sodium or homemade bread crumbs Bread, rolls and crackers with salted tops; quick breads; instant hot cereals; pancakes; commercial bread stuffing; self-rising flower and biscuit mixes; regular bread crumbs or cracker crumbs  Desserts and Sweets Desserts and sweets mad with mild should be within allowance Instant pudding mixes and cake mixes  Fats Butter or margarine; vegetable oils; unsalted salad dressings, regular salad dressings limited to 1 Tbs; light, sour and heavy cream Regular salad dressings containing bacon fat, bacon bits, and salt pork; snack dips made with instant soup mixes or processed  cheese; salted nuts  Fruits Most fresh, frozen and canned fruits Fruits processed with salt or sodium-containing ingredient (some dried fruits are processed with sodium sulfites        Vegetables Fresh, frozen vegetables and low- sodium canned vegetables Regular canned vegetables, sauerkraut, pickled vegetables, and others prepared in brine; frozen vegetables in sauces; vegetables seasoned with ham, bacon or salt pork  Condiments, Sauces, Miscellaneous  Salt substitute with physician's approval; pepper, herbs, spices; vinegar, lemon or lime juice; hot pepper sauce; garlic powder, onion powder, low sodium soy sauce (1 Tbs.); low sodium condiments (ketchup, chili sauce, mustard) in limited amounts (1 tsp.) fresh ground horseradish; unsalted tortilla chips, pretzels, potato chips, popcorn, salsa (1/4 cup) Any seasoning made with salt including garlic salt, celery salt, onion salt, and seasoned salt; sea salt, rock salt, kosher salt; meat tenderizers; monosodium glutamate; mustard, regular soy sauce, barbecue, sauce, chili sauce, teriyaki sauce, steak sauce, Worcestershire sauce, and most flavored vinegars; canned gravy and mixes; regular condiments; salted snack foods, olives, picles, relish, horseradish sauce, catsup   Food preparation: Try these seasonings Meats:    Pork Sage, onion Serve with applesauce  Chicken Poultry seasoning, thyme, parsley Serve with cranberry sauce  Lamb Curry powder, rosemary, garlic, thyme Serve with mint sauce or jelly  Veal Marjoram, basil Serve with current jelly, cranberry sauce  Beef Pepper, bay leaf Serve with dry mustard, unsalted chive butter  [place] leaf, dill  Serve with unsalted lemon butter, unsalted parsley butter  Vegetables:    Asparagus Lemon juice   Broccoli Lemon juice   Carrots Mustard dressing parsley, mint, nutmeg, glazed with unsalted butter and sugar   Green beans Marjoram, lemon juice, nutmeg,dill seed   Tomatoes Basil, marjoram, onion    Spice /blend for [employer]" 4 tsp ground thyme 1 tsp ground sage 3 tsp ground rosemary 4 tsp ground marjoram   Test your knowledge A product that says "Salt Free" may still contain sodium. True or False Garlic Powder and Hot Pepper Sauce an be used as alternative seasonings.True or False Processed foods have more sodium than fresh foods.  True or False Canned Vegetables have less sodium than froze True or False   WAYS TO DECREASE YOUR SODIUM INTAKE Avoid the use of added salt in cooking and at the table.  Table salt (and other prepared seasonings which contain salt) is probably one of the greatest sources of sodium in the diet.  Unsalted foods can gain flavor from the sweet, sour, and butter taste sensations of herbs and spices.  Instead of using salt for seasoning, try the following seasonings with the foods listed.  Remember: how you use them to enhance natural food flavors is limited only by your creativity... Allspice-Meat, fish, eggs, fruit, peas, red and yellow vegetables Almond Extract-Fruit baked goods Anise Seed-Sweet breads, fruit, carrots, beets, cottage cheese, cookies (tastes like licorice) Basil-Meat, fish, eggs, vegetables, rice, vegetables salads, soups, sauces Bay Leaf-Meat, fish, stews, poultry Burnet-Salad, vegetables (cucumber-like flavor) Caraway Seed-Bread, cookies, cottage cheese, meat, vegetables, cheese, rice Cardamon-Baked goods, fruit, soups Celery Powder or seed-Salads, salad dressings, sauces, meatloaf, soup, bread.Do not use  celery salt Chervil-Meats, salads, fish, eggs, vegetables, cottage cheese (parsley-like flavor) Chili Power-Meatloaf, chicken cheese, corn, eggplant, egg dishes Chives-Salads cottage cheese, egg dishes, soups, vegetables, sauces Cilantro-Salsa, casseroles Cinnamon-Baked goods, fruit, pork, lamb, chicken, carrots Cloves-Fruit, baked goods, fish, pot roast, green beans, beets, carrots Coriander-Pastry, cookies, meat, salads, cheese  (lemon-orange flavor) Cumin-Meatloaf, fish,cheese, eggs, cabbage,fruit pie (caraway flavor) [employer], fruit, eggs, fish, poultry, cottage cheese, vegetables Dill Seed-Meat, cottage cheese, poultry, vegetables, fish, salads, bread Fennel Seed-Bread, cookies, apples, pork, eggs, fish, beets, cabbage, cheese, Licorice-like flavor Garlic-(buds or powder) Salads, meat, poultry, fish, bread, butter, vegetables, potatoes.Do not  use garlic salt Ginger-Fruit, vegetables, baked goods, meat, fish, poultry Horseradish Root-Meet, vegetables, butter Lemon Juice or Extract-Vegetables, fruit, tea, baked goods, fish salads Mace-Baked goods fruit, vegetables, fish, poultry (taste like nutmeg) Maple Extract-Syrups Marjoram-Meat, chicken, fish, vegetables, breads, green salads (taste like Sage) Mint-Tea, lamb, sherbet, vegetables, desserts, carrots, cabbage Mustard, Dry or Seed-Cheese, eggs, meats, vegetables, poultry Nutmeg-Baked goods, fruit, chicken, eggs, vegetables, desserts Onion Powder-Meat, fish, poultry, vegetables, cheese, eggs, bread, rice salads (Do not use   Onion salt) Orange Extract-Desserts, baked goods Oregano-Pasta, eggs, cheese, onions, pork, lamb, fish, chicken, vegetables, green salads Paprika-Meat, fish, poultry, eggs, cheese, vegetables Parsley Flakes-Butter, vegetables, meat fish, poultry, eggs, bread, salads (certain forms may   Contain sodium Pepper-Meat fish, poultry, vegetables, eggs Peppermint Extract-Desserts, baked goods Poppy Seed-Eggs, bread, cheese, fruit dressings, baked goods, noodles, vegetables, cottage  [employer], poultry, meat, fish, cauliflower, turnips,eggs bread Saffron-Rice, bread, veal, chicken, fish, eggs Sage-Meat, fish, poultry, onions, eggplant, tomateos, pork, stews Savory-Eggs, salads, poultry, meat, rice, vegetables, soups, pork Tarragon-Meat, poultry, fish, eggs, butter, vegetables (licorice-like  flavor)  Thyme-Meat, poultry, fish, eggs, vegetables, (clover-like flavor), sauces, soups Tumeric-Salads, butter, eggs, fish, rice, vegetables (saffron-like flavor) Vanilla Extract-Baked goods, candy Vinegar-Salads, vegetables,  meat marinades Walnut Extract-baked goods, candy   2. Choose your Foods Wisely   The following is a list of foods to avoid which are high in sodium:  Meats-Avoid all smoked, canned, salt cured, dried and kosher meat and fish as well as Anchovies   [employer] meats:Bologna, Liverwurst, Pastrami Canned meat or fish  Marinated herring Caviar    Pepperoni Corned Beef   Pizza Dried chipped beef  Salami Frozen breaded fish or meat Salt pork Frankfurters or hot dogs  Sardines Gefilte fish   Sausage Ham (boiled ham, Proscuitto Smoked butt    spiced ham)   Spam      TV Dinners Vegetables Canned vegetables (Regular) Relish Canned mushrooms  Sauerkraut Olives    Tomato juice Pickles  Bakery and Dessert Products Canned puddings  Cream pies Cheesecake   Decorated cakes Cookies  Beverages/Juices Tomato juice, regular  Gatorade   V-8 vegetable juice, regular  Breads and Cereals Biscuit mixes   Salted potato chips, corn chips, pretzels Bread stuffing mixes  Salted crackers and rolls Pancake and waffle mixes Self-rising flour  Seasonings Accent    Meat sauces Barbecue sauce  Meat tenderizer Catsup    Monosodium glutamate (MSG) Celery salt   Onion salt Chili sauce   Prepared mustard Garlic salt   Salt, seasoned salt, sea salt Gravy mixes   Soy sauce Horseradish   Steak sauce Ketchup   Tartar sauce Lite salt    Teriyaki sauce Marinade mixes   Worcestershire sauce  Others Baking powder   Cocoa and cocoa mixes Baking soda   Commercial casserole mixes Candy-caramels, chocolate  Dehydrated soups    Bars, fudge,nougats  Instant rice and pasta mixes Canned broth or soup  Maraschino cherries Cheese, aged and processed cheese and cheese  spreads  Learning Assessment Quiz  Indicated T (for True) or F (for False) for each of the following statements:  _____ Fresh fruits and vegetables and unprocessed grains are generally low in sodium _____ Water may contain a considerable amount of sodium, depending on the source _____ You can always tell if a food is high in sodium by tasting it _____ Certain laxatives my be high in sodium and should be avoided unless prescribed   by a physician or pharmacist _____ Salt substitutes may be used freely by anyone on a sodium restricted diet _____ Sodium is present in table salt, food additives and as a natural component of   most foods _____ Table salt is approximately 90% sodium _____ Limiting sodium intake may help prevent excess fluid accumulation in the body _____ On a sodium-restricted diet, seasonings such as bouillon soy sauce, and    cooking wine should be used in place of table salt _____ On an ingredient list, a product which lists monosodium glutamate as the first   ingredient is an appropriate food to include on a low sodium diet  Circle the best answer(s) to the following statements (Hint: there may be more than one correct answer)  11. On a low-sodium diet, some acceptable snack items are:    A. Olives  F. Bean dip   K. Grapefruit juice    B. Salted Pretzels G. Commercial Popcorn   L. Canned peaches    C. Carrot Sticks  H. Bouillon   M. Unsalted nuts   D. [place] fries  I. Peanut butter crackers N. Salami   E. Sweet pickles J. Tomato Juice   O. Pizza  12.  Seasonings that may be  used freely on a reduced - sodium diet include   A. Lemon wedges F.Monosodium glutamate K. Celery seed    B.Soysauce   G. Pepper   L. Mustard powder   C. Sea salt  H. Cooking wine  M. Onion flakes   D. Vinegar  E. Prepared horseradish N. Salsa   E. Sage   J. Worcestershire sauce  O. [address]     Sumner Boast, PA-C  09/29/2021 11:42 AM    [facility]  [facility], [place], [place]  [address] Phone: [phone]; Fax: [phone]

## 2021-09-22 ENCOUNTER — Telehealth: Payer: Self-pay | Admitting: Cardiovascular Disease

## 2021-09-22 NOTE — Telephone Encounter (Signed)
Pt c/o BP issue: STAT if pt c/o blurred vision, one-sided weakness or slurred speech  1. What are your last 5 BP readings?  160/100 this morning 143/95 today 163/101 11/21 140/83 11/20 141/82 11/19   2. Are you having any other symptoms (ex. Dizziness, headache, blurred vision, passed out)? No, he feels fine.   3. What is your BP issue? BP running high, states he is taking his BP medication as prescribed.

## 2021-09-22 NOTE — Telephone Encounter (Signed)
Spoke with patient.  He said he is feeling fine, riding his bike and exercising as usual.  Got a new BP cuff and so has been using it.  We discussed feet flat on floor, sitting quietly for min of 5 minutes prior to checking BP.  He has not been doing this.  He will start taking daily BPs following those guidelines and bring a list w him to his appointment with Gerrianne Scale, PA-C next week.  Pt grateful for assistance provided today.

## 2021-09-29 ENCOUNTER — Other Ambulatory Visit: Payer: Self-pay

## 2021-09-29 ENCOUNTER — Encounter: Payer: Self-pay | Admitting: Physician Assistant

## 2021-09-29 ENCOUNTER — Ambulatory Visit: Payer: Medicare HMO | Admitting: Physician Assistant

## 2021-09-29 VITALS — BP 150/86 | HR 70 | Ht 71.0 in | Wt 175.2 lb

## 2021-09-29 DIAGNOSIS — I6523 Occlusion and stenosis of bilateral carotid arteries: Secondary | ICD-10-CM

## 2021-09-29 DIAGNOSIS — I10 Essential (primary) hypertension: Secondary | ICD-10-CM

## 2021-09-29 DIAGNOSIS — I2510 Atherosclerotic heart disease of native coronary artery without angina pectoris: Secondary | ICD-10-CM

## 2021-09-29 DIAGNOSIS — E785 Hyperlipidemia, unspecified: Secondary | ICD-10-CM

## 2021-09-29 DIAGNOSIS — I255 Ischemic cardiomyopathy: Secondary | ICD-10-CM | POA: Diagnosis not present

## 2021-09-29 NOTE — Patient Instructions (Signed)
Medication Instructions:  Your physician recommends that you continue on your current medications as directed. Please refer to the Current Medication list given to you today.  *If you need a refill on your cardiac medications before your next appointment, please call your pharmacy*   Lab Work: None If you have labs (blood work) drawn today and your tests are completely normal, you will receive your results only by: [place] (if you have MyChart) OR A paper copy in the mail If you have any lab test that is abnormal or we need to change your treatment, we will call you to review the results.   Testing/Procedures: Your physician has requested that you have an echocardiogram. Echocardiography is a painless test that uses sound waves to create images of your heart. It provides your doctor with information about the size and shape of your heart and how well your heart's chambers and valves are working. This procedure takes approximately one hour. There are no restrictions for this procedure.   Follow-Up: At [facility], you and your health needs are our priority.  As part of our continuing mission to provide you with exceptional heart care, we have created designated Provider Care Teams.  These Care Teams include your primary Cardiologist (physician) and Advanced Practice Providers (APPs -  Physician Assistants and Nurse Practitioners) who all work together to provide you with the care you need, when you need it.   Your next appointment:   1 year(s)  The format for your next appointment:   In Person  Provider:   Lauree Chandler, MD   Other Instructions Keep a record of Blood Pressure readings and send to [place] in 2 weeks.  Two Gram Sodium Diet 2000 mg  What is Sodium? Sodium is a mineral found naturally in many foods. The most significant source of sodium in the diet is table salt, which is about 40% sodium.  Processed, convenience, and preserved foods also contain a large  amount of sodium.  The body needs only 500 mg of sodium daily to function,  A normal diet provides more than enough sodium even if you do not use salt.  Why Limit Sodium? A build up of sodium in the body can cause thirst, increased blood pressure, shortness of breath, and water retention.  Decreasing sodium in the diet can reduce edema and risk of heart attack or stroke associated with high blood pressure.  Keep in mind that there are many other factors involved in these health problems.  Heredity, obesity, lack of exercise, cigarette smoking, stress and what you eat all play a role.  General Guidelines: Do not add salt at the table or in cooking.  One teaspoon of salt contains over 2 grams of sodium. Read food labels Avoid processed and convenience foods Ask your dietitian before eating any foods not dicussed in the menu planning guidelines Consult your physician if you wish to use a salt substitute or a sodium containing medication such as antacids.  Limit milk and milk products to 16 oz (2 cups) per day.  Shopping Hints: READ LABELS!! "Dietetic" does not necessarily mean low sodium. Salt and other sodium ingredients are often added to foods during processing.    Menu Planning Guidelines Food Group Choose More Often Avoid  Beverages (see also the milk group All fruit juices, low-sodium, salt-free vegetables juices, low-sodium carbonated beverages Regular vegetable or tomato juices, commercially softened water used for drinking or cooking  Breads and Cereals Enriched white, wheat, rye and pumpernickel bread, hard  rolls and dinner rolls; muffins, cornbread and waffles; most dry cereals, cooked cereal without added salt; unsalted crackers and breadsticks; low sodium or homemade bread crumbs Bread, rolls and crackers with salted tops; quick breads; instant hot cereals; pancakes; commercial bread stuffing; self-rising flower and biscuit mixes; regular bread crumbs or cracker crumbs  Desserts and  Sweets Desserts and sweets mad with mild should be within allowance Instant pudding mixes and cake mixes  Fats Butter or margarine; vegetable oils; unsalted salad dressings, regular salad dressings limited to 1 Tbs; light, sour and heavy cream Regular salad dressings containing bacon fat, bacon bits, and salt pork; snack dips made with instant soup mixes or processed cheese; salted nuts  Fruits Most fresh, frozen and canned fruits Fruits processed with salt or sodium-containing ingredient (some dried fruits are processed with sodium sulfites        Vegetables Fresh, frozen vegetables and low- sodium canned vegetables Regular canned vegetables, sauerkraut, pickled vegetables, and others prepared in brine; frozen vegetables in sauces; vegetables seasoned with ham, bacon or salt pork  Condiments, Sauces, Miscellaneous  Salt substitute with physician's approval; pepper, herbs, spices; vinegar, lemon or lime juice; hot pepper sauce; garlic powder, onion powder, low sodium soy sauce (1 Tbs.); low sodium condiments (ketchup, chili sauce, mustard) in limited amounts (1 tsp.) fresh ground horseradish; unsalted tortilla chips, pretzels, potato chips, popcorn, salsa (1/4 cup) Any seasoning made with salt including garlic salt, celery salt, onion salt, and seasoned salt; sea salt, rock salt, kosher salt; meat tenderizers; monosodium glutamate; mustard, regular soy sauce, barbecue, sauce, chili sauce, teriyaki sauce, steak sauce, Worcestershire sauce, and most flavored vinegars; canned gravy and mixes; regular condiments; salted snack foods, olives, picles, relish, horseradish sauce, catsup   Food preparation: Try these seasonings Meats:    Pork Sage, onion Serve with applesauce  Chicken Poultry seasoning, thyme, parsley Serve with cranberry sauce  Lamb Curry powder, rosemary, garlic, thyme Serve with mint sauce or jelly  Veal Marjoram, basil Serve with current jelly, cranberry sauce  Beef Pepper, bay leaf  Serve with dry mustard, unsalted chive butter  [place] leaf, dill Serve with unsalted lemon butter, unsalted parsley butter  Vegetables:    Asparagus Lemon juice   Broccoli Lemon juice   Carrots Mustard dressing parsley, mint, nutmeg, glazed with unsalted butter and sugar   Green beans Marjoram, lemon juice, nutmeg,dill seed   Tomatoes Basil, marjoram, onion   Spice /blend for [employer]" 4 tsp ground thyme 1 tsp ground sage 3 tsp ground rosemary 4 tsp ground marjoram   Test your knowledge A product that says "Salt Free" may still contain sodium. True or False Garlic Powder and Hot Pepper Sauce an be used as alternative seasonings.True or False Processed foods have more sodium than fresh foods.  True or False Canned Vegetables have less sodium than froze True or False   WAYS TO DECREASE YOUR SODIUM INTAKE Avoid the use of added salt in cooking and at the table.  Table salt (and other prepared seasonings which contain salt) is probably one of the greatest sources of sodium in the diet.  Unsalted foods can gain flavor from the sweet, sour, and butter taste sensations of herbs and spices.  Instead of using salt for seasoning, try the following seasonings with the foods listed.  Remember: how you use them to enhance natural food flavors is limited only by your creativity... Allspice-Meat, fish, eggs, fruit, peas, red and yellow vegetables Almond Extract-Fruit baked goods Anise Seed-Sweet breads, fruit, carrots,  beets, cottage cheese, cookies (tastes like licorice) Basil-Meat, fish, eggs, vegetables, rice, vegetables salads, soups, sauces Bay Leaf-Meat, fish, stews, poultry Burnet-Salad, vegetables (cucumber-like flavor) Caraway Seed-Bread, cookies, cottage cheese, meat, vegetables, cheese, rice Cardamon-Baked goods, fruit, soups Celery Powder or seed-Salads, salad dressings, sauces, meatloaf, soup, bread.Do not use  celery salt Chervil-Meats, salads, fish, eggs, vegetables, cottage  cheese (parsley-like flavor) Chili Power-Meatloaf, chicken cheese, corn, eggplant, egg dishes Chives-Salads cottage cheese, egg dishes, soups, vegetables, sauces Cilantro-Salsa, casseroles Cinnamon-Baked goods, fruit, pork, lamb, chicken, carrots Cloves-Fruit, baked goods, fish, pot roast, green beans, beets, carrots Coriander-Pastry, cookies, meat, salads, cheese (lemon-orange flavor) Cumin-Meatloaf, fish,cheese, eggs, cabbage,fruit pie (caraway flavor) [employer], fruit, eggs, fish, poultry, cottage cheese, vegetables Dill Seed-Meat, cottage cheese, poultry, vegetables, fish, salads, bread Fennel Seed-Bread, cookies, apples, pork, eggs, fish, beets, cabbage, cheese, Licorice-like flavor Garlic-(buds or powder) Salads, meat, poultry, fish, bread, butter, vegetables, potatoes.Do not  use garlic salt Ginger-Fruit, vegetables, baked goods, meat, fish, poultry Horseradish Root-Meet, vegetables, butter Lemon Juice or Extract-Vegetables, fruit, tea, baked goods, fish salads Mace-Baked goods fruit, vegetables, fish, poultry (taste like nutmeg) Maple Extract-Syrups Marjoram-Meat, chicken, fish, vegetables, breads, green salads (taste like Sage) Mint-Tea, lamb, sherbet, vegetables, desserts, carrots, cabbage Mustard, Dry or Seed-Cheese, eggs, meats, vegetables, poultry Nutmeg-Baked goods, fruit, chicken, eggs, vegetables, desserts Onion Powder-Meat, fish, poultry, vegetables, cheese, eggs, bread, rice salads (Do not use   Onion salt) Orange Extract-Desserts, baked goods Oregano-Pasta, eggs, cheese, onions, pork, lamb, fish, chicken, vegetables, green salads Paprika-Meat, fish, poultry, eggs, cheese, vegetables Parsley Flakes-Butter, vegetables, meat fish, poultry, eggs, bread, salads (certain forms may   Contain sodium Pepper-Meat fish, poultry, vegetables, eggs Peppermint Extract-Desserts, baked goods Poppy Seed-Eggs, bread, cheese, fruit dressings, baked goods, noodles, vegetables,  cottage  [employer], poultry, meat, fish, cauliflower, turnips,eggs bread Saffron-Rice, bread, veal, chicken, fish, eggs Sage-Meat, fish, poultry, onions, eggplant, tomateos, pork, stews Savory-Eggs, salads, poultry, meat, rice, vegetables, soups, pork Tarragon-Meat, poultry, fish, eggs, butter, vegetables (licorice-like flavor)  Thyme-Meat, poultry, fish, eggs, vegetables, (clover-like flavor), sauces, soups Tumeric-Salads, butter, eggs, fish, rice, vegetables (saffron-like flavor) Vanilla Extract-Baked goods, candy Vinegar-Salads, vegetables, meat marinades Walnut Extract-baked goods, candy   2. Choose your Foods Wisely   The following is a list of foods to avoid which are high in sodium:  Meats-Avoid all smoked, canned, salt cured, dried and kosher meat and fish as well as Anchovies   [employer] meats:Bologna, Liverwurst, Pastrami Canned meat or fish  Marinated herring Caviar    Pepperoni Corned Beef   Pizza Dried chipped beef  Salami Frozen breaded fish or meat Salt pork Frankfurters or hot dogs  Sardines Gefilte fish   Sausage Ham (boiled ham, Proscuitto Smoked butt    spiced ham)   Spam      TV Dinners Vegetables Canned vegetables (Regular) Relish Canned mushrooms  Sauerkraut Olives    Tomato juice Pickles  Bakery and Dessert Products Canned puddings  Cream pies Cheesecake   Decorated cakes Cookies  Beverages/Juices Tomato juice, regular  Gatorade   V-8 vegetable juice, regular  Breads and Cereals Biscuit mixes   Salted potato chips, corn chips, pretzels Bread stuffing mixes  Salted crackers and rolls Pancake and waffle mixes Self-rising flour  Seasonings Accent    Meat sauces Barbecue sauce  Meat tenderizer Catsup    Monosodium glutamate (MSG) Celery salt   Onion salt Chili sauce   Prepared mustard Garlic salt   Salt, seasoned salt, sea salt Gravy mixes   Soy sauce  Horseradish   Steak  sauce Ketchup   Tartar sauce Lite salt    Teriyaki sauce Marinade mixes   Worcestershire sauce  Others Baking powder   Cocoa and cocoa mixes Baking soda   Commercial casserole mixes Candy-caramels, chocolate  Dehydrated soups    Bars, fudge,nougats  Instant rice and pasta mixes Canned broth or soup  Maraschino cherries Cheese, aged and processed cheese and cheese spreads  Learning Assessment Quiz  Indicated T (for True) or F (for False) for each of the following statements:  _____ Fresh fruits and vegetables and unprocessed grains are generally low in sodium _____ Water may contain a considerable amount of sodium, depending on the source _____ You can always tell if a food is high in sodium by tasting it _____ Certain laxatives my be high in sodium and should be avoided unless prescribed   by a physician or pharmacist _____ Salt substitutes may be used freely by anyone on a sodium restricted diet _____ Sodium is present in table salt, food additives and as a natural component of   most foods _____ Table salt is approximately 90% sodium _____ Limiting sodium intake may help prevent excess fluid accumulation in the body _____ On a sodium-restricted diet, seasonings such as bouillon soy sauce, and    cooking wine should be used in place of table salt _____ On an ingredient list, a product which lists monosodium glutamate as the first   ingredient is an appropriate food to include on a low sodium diet  Circle the best answer(s) to the following statements (Hint: there may be more than one correct answer)  11. On a low-sodium diet, some acceptable snack items are:    A. Olives  F. Bean dip   K. Grapefruit juice    B. Salted Pretzels G. Commercial Popcorn   L. Canned peaches    C. Carrot Sticks  H. Bouillon   M. Unsalted nuts   D. [place] fries  I. Peanut butter crackers N. Salami   E. Sweet pickles J. Tomato Juice   O. Pizza  12.  Seasonings that may be used freely on a reduced -  sodium diet include   A. Lemon wedges F.Monosodium glutamate K. Celery seed    B.Soysauce   G. Pepper   L. Mustard powder   C. Sea salt  H. Cooking wine  M. Onion flakes   D. Vinegar  E. Prepared horseradish N. Salsa   E. Sage   J. Worcestershire sauce  O. Chutney

## 2021-10-08 DIAGNOSIS — K625 Hemorrhage of anus and rectum: Secondary | ICD-10-CM | POA: Diagnosis not present

## 2021-10-08 DIAGNOSIS — K648 Other hemorrhoids: Secondary | ICD-10-CM | POA: Diagnosis not present

## 2021-10-16 ENCOUNTER — Ambulatory Visit (HOSPITAL_COMMUNITY): Payer: Medicare HMO | Attending: Cardiovascular Disease

## 2021-10-16 ENCOUNTER — Other Ambulatory Visit: Payer: Self-pay

## 2021-10-16 DIAGNOSIS — I255 Ischemic cardiomyopathy: Secondary | ICD-10-CM | POA: Diagnosis not present

## 2021-10-16 LAB — ECHOCARDIOGRAM COMPLETE
Area-P 1/2: 2.76 cm2
P 1/2 time: 606 msec
S' Lateral: 3.75 cm

## 2021-10-22 ENCOUNTER — Telehealth: Payer: Self-pay | Admitting: Cardiovascular Disease

## 2021-10-22 NOTE — Telephone Encounter (Signed)
Called pt reviewed results and Provider recommendations.  All questions answered.  Pt verbalizes understanding.

## 2021-10-22 NOTE — Telephone Encounter (Signed)
Dillon Middleton is returning call to triage from this morning for Echo results

## 2021-10-26 DIAGNOSIS — I2510 Atherosclerotic heart disease of native coronary artery without angina pectoris: Secondary | ICD-10-CM | POA: Diagnosis not present

## 2021-10-26 DIAGNOSIS — E7800 Pure hypercholesterolemia, unspecified: Secondary | ICD-10-CM | POA: Diagnosis not present

## 2021-10-26 DIAGNOSIS — I10 Essential (primary) hypertension: Secondary | ICD-10-CM | POA: Diagnosis not present

## 2021-11-04 ENCOUNTER — Telehealth: Payer: Self-pay

## 2021-11-04 MED ORDER — LOSARTAN POTASSIUM 50 MG PO TABS: 50.0000 mg | ORAL_TABLET | Freq: Two times a day (BID) | ORAL | 2 refills | Status: DC

## 2021-11-04 NOTE — Telephone Encounter (Signed)
Pt brought in BP readings. After reviewing Dillon Barrios, PA suggested he increase Losartan to 75mg  daily.  When I called the pt to give him these instructions he said that since he brought that paper by our office he has cut out all salt and his BP is a lot better. He also stated that per his PCP he is taking Losartan 50mg /BID. I advised him to continue doing what he's doing and if his BP starts to trend up again to let us know.  I have updated his chart with the correct dose of Losartan.

## 2021-12-17 DIAGNOSIS — H52203 Unspecified astigmatism, bilateral: Secondary | ICD-10-CM | POA: Diagnosis not present

## 2021-12-17 DIAGNOSIS — H524 Presbyopia: Secondary | ICD-10-CM | POA: Diagnosis not present

## 2021-12-17 DIAGNOSIS — Z961 Presence of intraocular lens: Secondary | ICD-10-CM | POA: Diagnosis not present

## 2021-12-17 DIAGNOSIS — Z0100 Encounter for examination of eyes and vision without abnormal findings: Secondary | ICD-10-CM | POA: Diagnosis not present

## 2021-12-17 DIAGNOSIS — H5213 Myopia, bilateral: Secondary | ICD-10-CM | POA: Diagnosis not present

## 2021-12-17 DIAGNOSIS — H25811 Combined forms of age-related cataract, right eye: Secondary | ICD-10-CM | POA: Diagnosis not present

## 2021-12-29 DIAGNOSIS — I10 Essential (primary) hypertension: Secondary | ICD-10-CM | POA: Diagnosis not present

## 2022-01-29 DIAGNOSIS — I10 Essential (primary) hypertension: Secondary | ICD-10-CM | POA: Diagnosis not present

## 2022-02-28 DIAGNOSIS — I10 Essential (primary) hypertension: Secondary | ICD-10-CM | POA: Diagnosis not present

## 2022-03-22 DIAGNOSIS — I10 Essential (primary) hypertension: Secondary | ICD-10-CM | POA: Diagnosis not present

## 2022-03-22 DIAGNOSIS — E7800 Pure hypercholesterolemia, unspecified: Secondary | ICD-10-CM | POA: Diagnosis not present

## 2022-04-19 DIAGNOSIS — I10 Essential (primary) hypertension: Secondary | ICD-10-CM | POA: Diagnosis not present

## 2022-04-19 DIAGNOSIS — Z8546 Personal history of malignant neoplasm of prostate: Secondary | ICD-10-CM | POA: Diagnosis not present

## 2022-04-19 DIAGNOSIS — E7800 Pure hypercholesterolemia, unspecified: Secondary | ICD-10-CM | POA: Diagnosis not present

## 2022-04-22 DIAGNOSIS — Z0000 Encounter for general adult medical examination without abnormal findings: Secondary | ICD-10-CM | POA: Diagnosis not present

## 2022-04-22 DIAGNOSIS — K649 Unspecified hemorrhoids: Secondary | ICD-10-CM | POA: Diagnosis not present

## 2022-04-22 DIAGNOSIS — N529 Male erectile dysfunction, unspecified: Secondary | ICD-10-CM | POA: Diagnosis not present

## 2022-04-22 DIAGNOSIS — E7800 Pure hypercholesterolemia, unspecified: Secondary | ICD-10-CM | POA: Diagnosis not present

## 2022-04-22 DIAGNOSIS — Z8546 Personal history of malignant neoplasm of prostate: Secondary | ICD-10-CM | POA: Diagnosis not present

## 2022-04-22 DIAGNOSIS — R21 Rash and other nonspecific skin eruption: Secondary | ICD-10-CM | POA: Diagnosis not present

## 2022-04-22 DIAGNOSIS — I10 Essential (primary) hypertension: Secondary | ICD-10-CM | POA: Diagnosis not present

## 2022-04-22 DIAGNOSIS — I77819 Aortic ectasia, unspecified site: Secondary | ICD-10-CM | POA: Diagnosis not present

## 2022-04-22 DIAGNOSIS — I700 Atherosclerosis of aorta: Secondary | ICD-10-CM | POA: Diagnosis not present

## 2022-06-01 DIAGNOSIS — E7800 Pure hypercholesterolemia, unspecified: Secondary | ICD-10-CM | POA: Diagnosis not present

## 2022-06-01 DIAGNOSIS — I10 Essential (primary) hypertension: Secondary | ICD-10-CM | POA: Diagnosis not present

## 2022-06-01 DIAGNOSIS — N1831 Chronic kidney disease, stage 3a: Secondary | ICD-10-CM | POA: Diagnosis not present

## 2022-06-16 IMAGING — US US AORTA
1 series · 14 of 25 positions shown · non-contrast
Comparison: None.

CLINICAL DATA: Questionable enlarged aorta on physical examination.

EXAM:
ULTRASOUND OF ABDOMINAL AORTA
TECHNIQUE: Ultrasound examination of the abdominal aorta and proximal common
iliac arteries was performed to evaluate for aneurysm. Additional
color and Doppler images of the distal aorta were obtained to
document patency.

[Series 1: us aorta · 0.20mm/px · 14 of 50 slices shown]
[im 1/50]
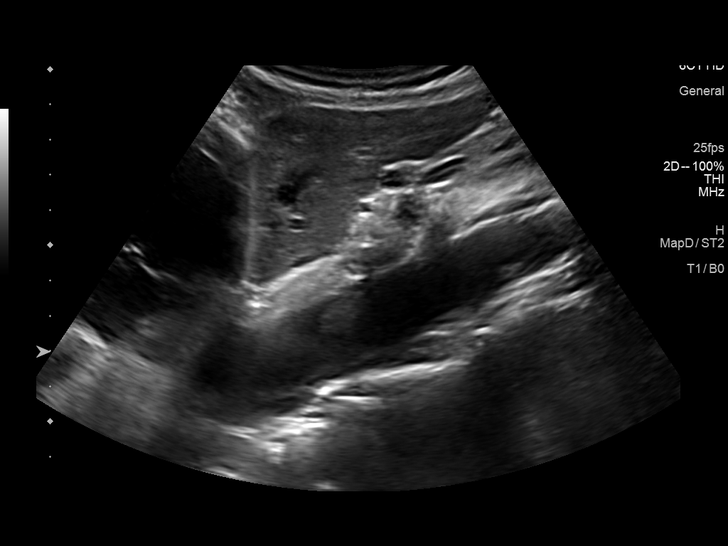
[im 5/50]
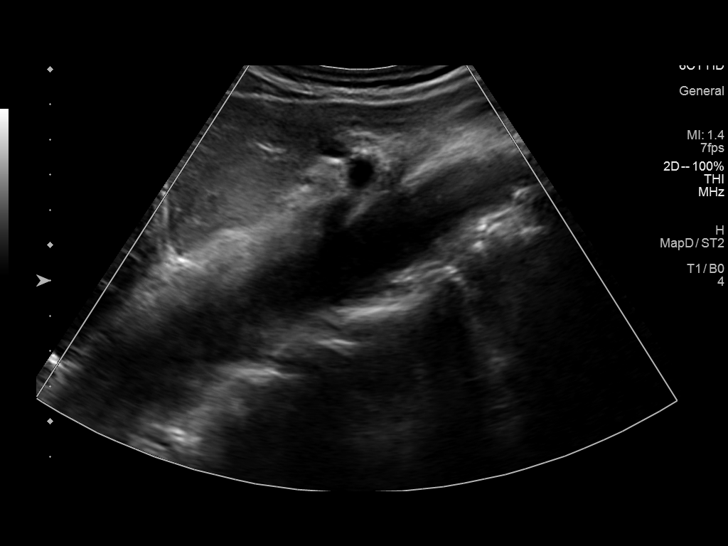
[im 9/50]
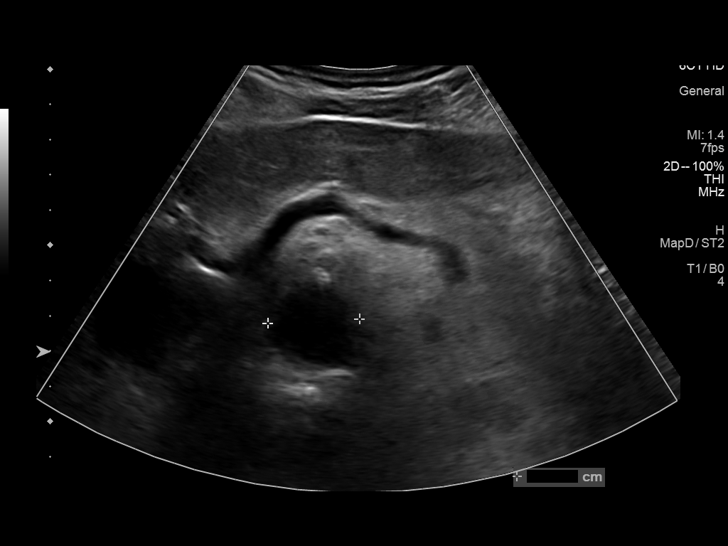
[im 13/50]
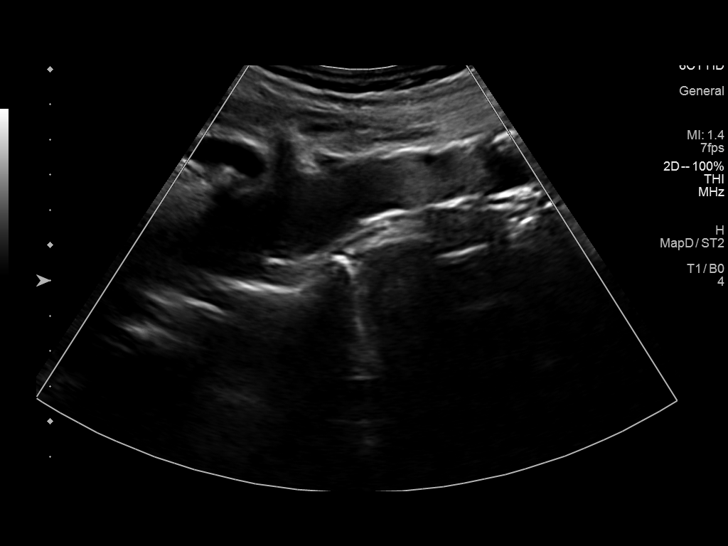
[im 17/50]
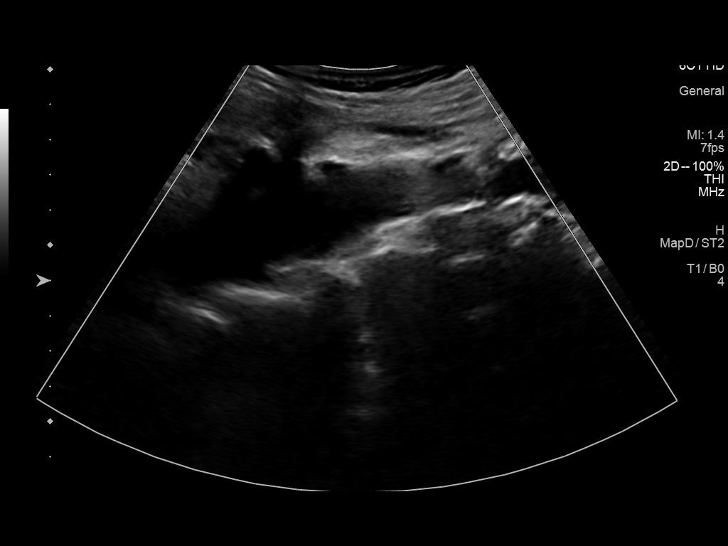
[im 19/50]
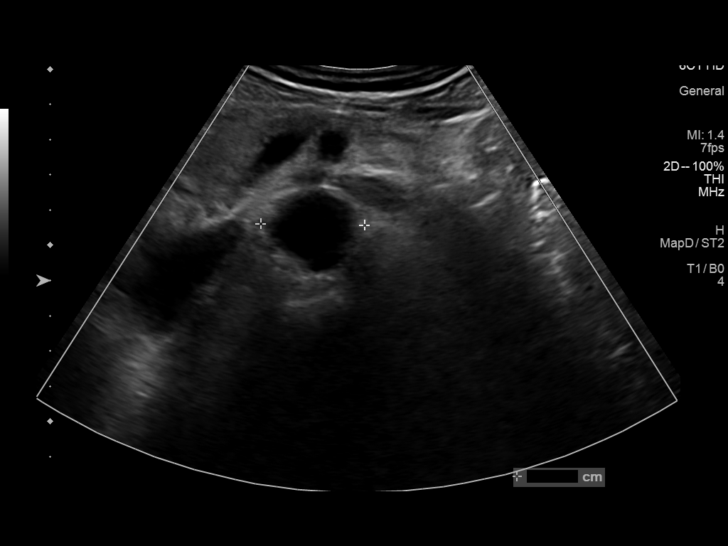
[im 23/50]
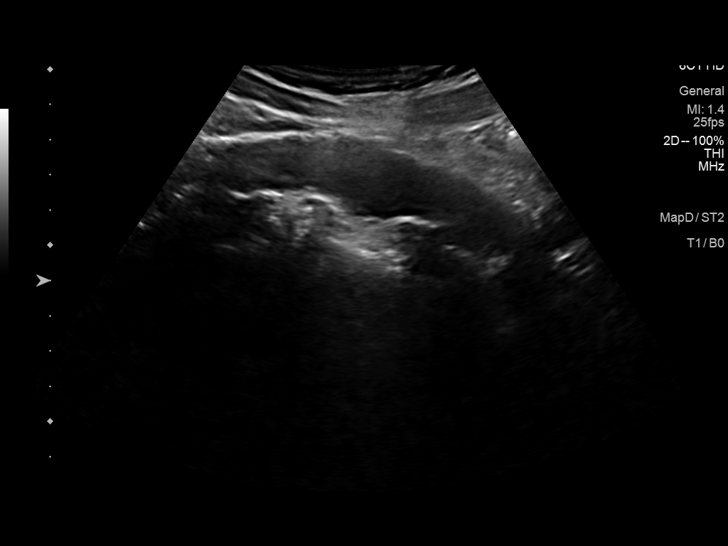
[im 27/50]
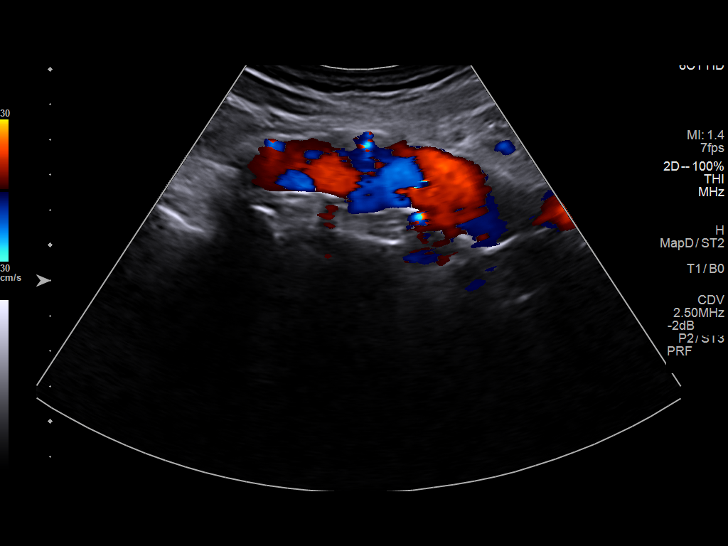
[im 31/50]
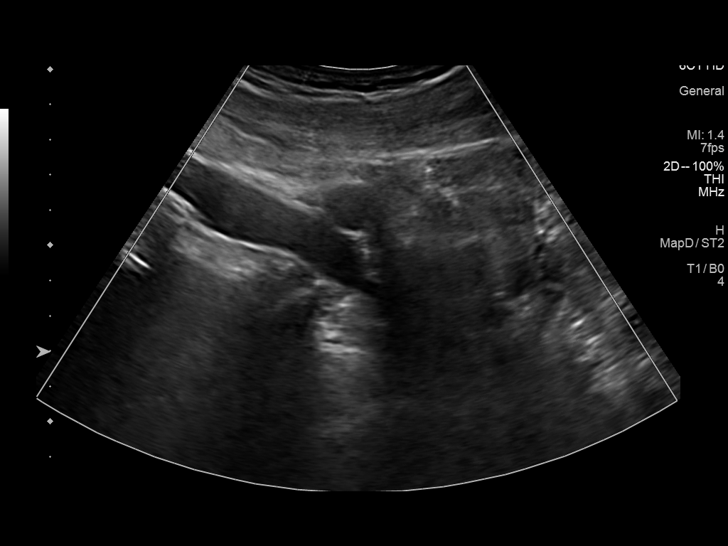
[im 33/50]
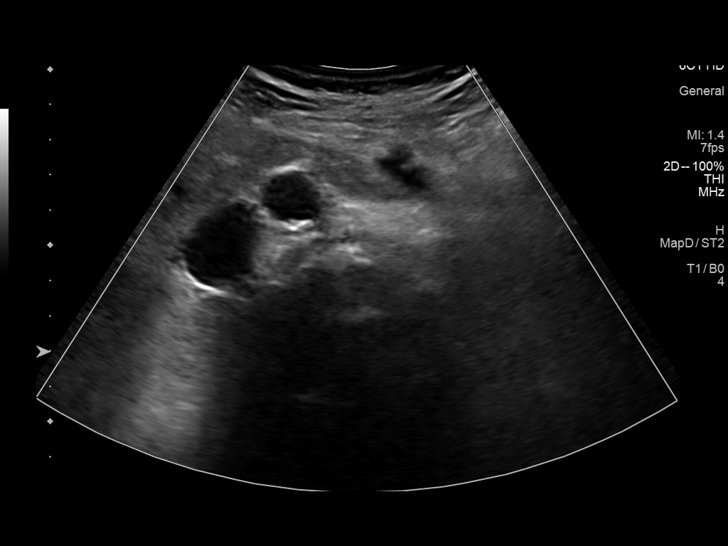
[im 37/50]
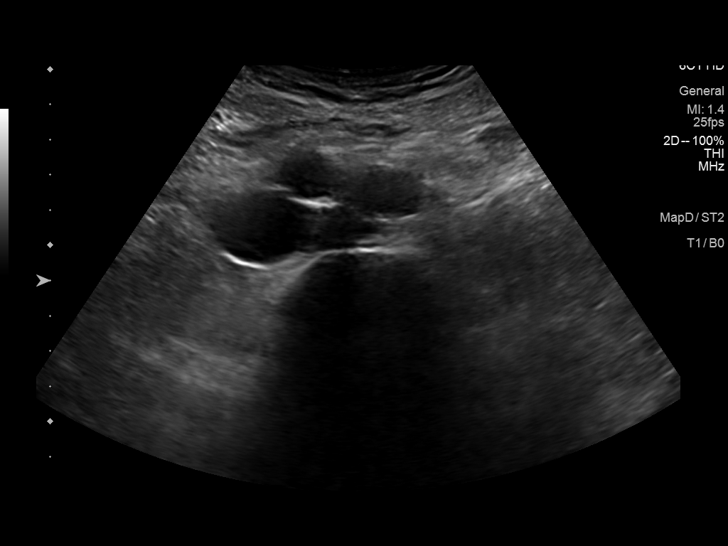
[im 41/50]
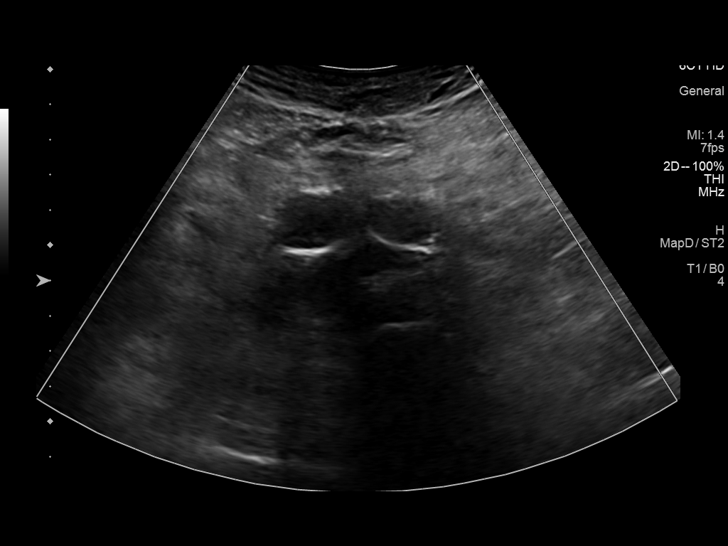
[im 45/50]
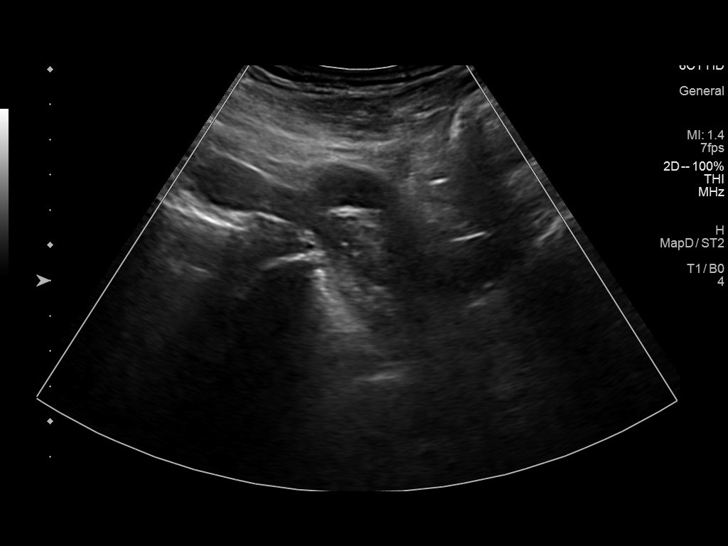
[im 50/50]
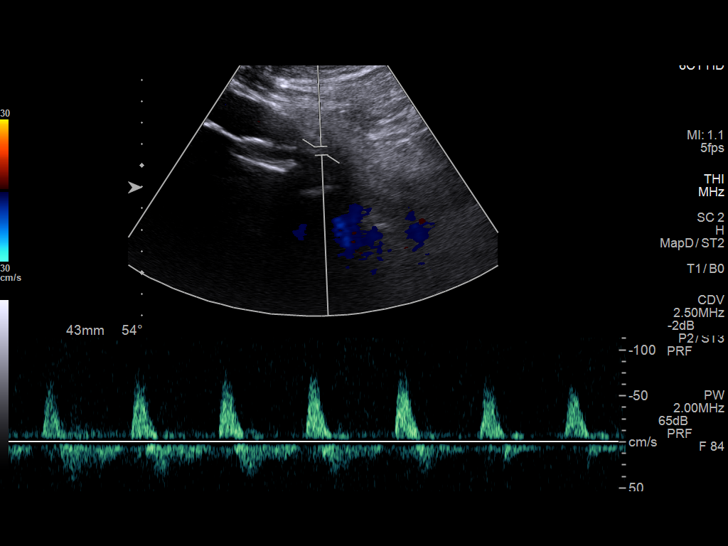

[14 of 25 positions shown; findings below may reference images not displayed]

FINDINGS: Abdominal aortic measurements as follows: AP x TRV

Proximal:  2.6 x 2.6 cm

Mid:  2.9 x 2.9 cm

Distal:  2.5 x 2.6 cm
Patent: Yes, peak systolic velocity is 87 cm/s

Right common iliac artery: 1.5 cm

Left common iliac artery: 1.5 cm

Diffuse irregular aortic atherosclerotic calcifications.
IMPRESSION: 1. Abdominal aortic ectasia and atherosclerotic disease. Mid
abdominal aorta has a maximum diameter of 2.9 cm, distally 2.6 cm.
Ectatic abdominal aorta at risk for aneurysm development. Recommend
followup by ultrasound in 5 years. This recommendation follows ACR
consensus guidelines: White Paper of the ACR Incidental Findings
Committee II on Vascular Findings. [HOSPITAL] [id number];
Aortic aneurysm NOS ([id number])

## 2022-08-07 DIAGNOSIS — Z23 Encounter for immunization: Secondary | ICD-10-CM | POA: Diagnosis not present

## 2022-08-07 DIAGNOSIS — S61011A Laceration without foreign body of right thumb without damage to nail, initial encounter: Secondary | ICD-10-CM | POA: Diagnosis not present

## 2022-09-15 NOTE — Progress Notes (Signed)
Cardiology Office Note:    Date:  09/29/2022   ID:  Dillon Middleton, DOB Jul 01, 1936, MRN [id number]  PCP:  Dillon Middleton, [place] Providers Cardiologist:  Lauree Chandler, MD     Referring MD: Dillon Cruel, MD   Chief Complaint:  Follow-up     History of Present Illness:   Dillon Middleton is a 86 y.o. male with history of CAD status post anterior STEMI 2013 treated with DES to the mid LAD other vessels free of obstruction, ischemic cardiomyopathy ejection fraction 35 to 40% on echo 2018 no significant valvular disease, hypertension, HLD, mild bilateral carotid disease 2018.   Had abdominal US in June 2021-ectatic aorta.   I saw the patient 09/29/21 and BP running high. Was getting extra salt in his diet which he did cut out and brought his BP down. He had increased his losartan 50 mg bid. He road 83 miles on his 85rd birthday. I ordered repeat echo and LVEF improved 40-45%.  Patient comes in today for yearly f/u. Road 84 miles on his 44 th bday to [place]. Does heavy weights alternating with cardio every other day. Last Jan he went to [place] and climbed [employer]. No chest pain, DOE, dyspnea, dizziness or presyncope.           Past Medical History:  Diagnosis Date   CAD (coronary artery disease)    a. 01/2012 anterior STEMI PTCA/DES x 1 mid LAD   Hyperlipidemia    Hypertension    Ischemic cardiomyopathy    a. 02/06/12 Echo: EF 30-35% s/p LifeVest placement;   b. 03/21/2012 Echo: EF 35-40%, antlat, infsept HK, dist antersept and lat AK, Gr1 DD, lifevest was d/c'd   Current Medications: Current Meds  Medication Sig   amLODipine (NORVASC) 5 MG tablet TAKE 1 TABLET EVERY DAY   aspirin EC 81 MG tablet Take 1 tablet (81 mg total) by mouth daily.   atorvastatin (LIPITOR) 80 MG tablet Take 1 tablet (80 mg total) by mouth daily.   atovaquone-proguanil (MALARONE) 250-100 MG TABS tablet Take 1 tablet by mouth daily. Start on 11/5, take daily until  complete   azithromycin (ZITHROMAX) 500 MG tablet Take 1 tablet (500 mg total) by mouth daily. If you have 3+ loose stools/24 hr. Can stop taking if diarrhea stops   carvedilol (COREG) 6.25 MG tablet Take 1 tablet (6.25 mg total) by mouth 2 (two) times daily.   ciprofloxacin (CIPRO) 500 MG tablet Take 1 tablet (500 mg total) by mouth 2 (two) times daily. Take 1-2 days until diarrhea resolves   losartan (COZAAR) 50 MG tablet Take 1 tablet (50 mg total) by mouth 2 (two) times daily.   predniSONE (DELTASONE) 20 MG tablet 2 tabs po daily x 4 days    Allergies:   Lisinopril   Social History   Tobacco Use   Smoking status: Former   Smokeless tobacco: Never  Scientific laboratory technician Use: Never used  Substance Use Topics   Alcohol use: No   Drug use: No    Family Hx: The patient's family history includes Dementia in his mother; Hypertension in his sister; Stroke in his father.  ROS     Physical Exam:    VS:  BP 134/80   Pulse 72   Ht '5\' 11"'$  (1.803 m)   Wt 174 lb 12.8 oz (79.3 kg)   SpO2 95%   BMI 24.38 kg/m     Wt Readings from Last 3  Encounters:  09/29/22 174 lb 12.8 oz (79.3 kg)  09/29/21 175 lb 3.2 oz (79.5 kg)  10/01/20 177 lb 9.6 oz (80.6 kg)    Physical Exam  GEN: Thin, in no acute distress  Neck: no JVD, carotid bruits, or masses Cardiac:RRR; S4 2/6 systolic murmur LSB Respiratory:  clear to auscultation bilaterally, normal work of breathing GI: soft, nontender, nondistended, + BS Ext: without cyanosis, clubbing, or edema, Good distal pulses bilaterally Neuro:  Alert and Oriented x 3, Psych: euthymic mood, full affect        EKGs/Labs/Other Test Reviewed:    EKG:  EKG is   ordered today.  The ekg ordered today demonstrates NSR with RBBB, LAFB unchanged  Recent Labs: No results found for requested labs within last 365 days.   Recent Lipid Panel No results for input(s): "CHOL", "TRIG", "HDL", "VLDL", "LDLCALC", "LDLDIRECT" in the last 8760 hours.   Prior CV  Studies:   Echo 10/16/21 IMPRESSIONS     1. Hypokinesis of the mid-apical inferoseptal, apical septal, apical  anterior, apical, and apical anterolateral myocardium. Left ventricular  ejection fraction, by estimation, is 40 to 45%. The left ventricle has  mildly decreased function. The left  ventricle demonstrates regional wall motion abnormalities (see scoring  diagram/findings for description). There is mild concentric left  ventricular hypertrophy. Left ventricular diastolic parameters are  consistent with Grade I diastolic dysfunction  (impaired relaxation).   2. Right ventricular systolic function is normal. The right ventricular  size is normal. There is normal pulmonary artery systolic pressure.   3. Left atrial size was moderately dilated.   4. The mitral valve is normal in structure. Trivial mitral valve  regurgitation. No evidence of mitral stenosis.   5. The aortic valve is tricuspid. There is mild calcification of the  aortic valve. There is mild thickening of the aortic valve. Aortic valve  regurgitation is mild. Aortic valve sclerosis is present, with no evidence  of aortic valve stenosis. Aortic  regurgitation PHT measures 606 msec.   6. Aortic dilatation noted. There is borderline dilatation of the aortic  root, measuring 36 mm. There is mild dilatation of the ascending aorta,  measuring 39 mm.   7. The inferior vena cava is normal in size with greater than 50%  respiratory variability, suggesting right atrial pressure of 3 mmHg.   Comparison(s): Compared with the echo 08/2017, no significant changes.   FINDINGS   Left Ventricle: Hypokinesis of the mid-apical inferoseptal, apical  septal, apical anterior, apical, and apical anterolateral myocardium. Left  ventricular ejection fraction, by estimation, is 40 to 45%. The left  ventricle has mildly decreased function.  The left ventricle demonstrates regional wall motion abnormalities. The  left ventricular internal  cavity size was normal in size. There is mild  concentric left ventricular hypertrophy. Left ventricular diastolic  parameters are consistent with Grade I  diastolic dysfunction (impaired relaxation). Normal left ventricular  filling pressure.   Right Ventricle: ThCarotids 10/08/20 Summary:  Right Carotid: Velocities in the right ICA are consistent with a stable  1-39%                stenosis. Non-hemodynamically significant plaque <50% noted  in                the CCA.   Left Carotid: Velocities in the left ICA are consistent with a stable  1-39%               stenosis. Non-hemodynamically significant plaque <50% noted  in the                CCA.   Vertebrals:  Bilateral vertebral arteries demonstrate antegrade flow.  Subclavians: Normal flow hemodynamics were seen in bilateral subclavian               arteries.   *See table(s) above for measurements and observations.        Risk Assessment/Calculations/Metrics:              ASSESSMENT & PLAN:   No problem-specific Assessment & Plan notes found for this encounter.  CAD status post anterior STEMI 2013 treated with DES to the mid LAD other vessels free of obstruction. No angina, exercises everyday. Continue ASA, lipitor, coreg, cozaar   Ischemic cardiomyopathy ejection fraction improved 40-45% on echo in 2022 -no CHF symptoms   Essential hypertension BP controlled   Hyperlipidemia-LDL 62 04/2022 on lipitor   Mild bilateral carotid disease on Dopplers in 2021 1-39% bilateral ICA.                    Dispo:  No follow-ups on file.   Medication Adjustments/Labs and Tests Ordered: Current medicines are reviewed at length with the patient today.  Concerns regarding medicines are outlined above.  Tests Ordered: Orders Placed This Encounter  Procedures   EKG 12-Lead   Medication Changes: No orders of the defined types were placed in this encounter.  Signed, Ermalinda Barrios, PA-C  09/29/2022 9:39 AM    [facility], [place], [place]  [address] Phone: [phone]; Fax: [phone]

## 2022-09-20 DIAGNOSIS — I10 Essential (primary) hypertension: Secondary | ICD-10-CM | POA: Diagnosis not present

## 2022-09-20 DIAGNOSIS — Z6824 Body mass index (BMI) 24.0-24.9, adult: Secondary | ICD-10-CM | POA: Diagnosis not present

## 2022-09-20 DIAGNOSIS — J069 Acute upper respiratory infection, unspecified: Secondary | ICD-10-CM | POA: Diagnosis not present

## 2022-09-20 DIAGNOSIS — Z20828 Contact with and (suspected) exposure to other viral communicable diseases: Secondary | ICD-10-CM | POA: Diagnosis not present

## 2022-09-29 ENCOUNTER — Ambulatory Visit: Payer: Medicare HMO | Attending: Physician Assistant | Admitting: Physician Assistant

## 2022-09-29 ENCOUNTER — Encounter: Payer: Self-pay | Admitting: Physician Assistant

## 2022-09-29 VITALS — BP 134/80 | HR 72 | Ht 71.0 in | Wt 174.8 lb

## 2022-09-29 DIAGNOSIS — E785 Hyperlipidemia, unspecified: Secondary | ICD-10-CM | POA: Diagnosis not present

## 2022-09-29 DIAGNOSIS — I6523 Occlusion and stenosis of bilateral carotid arteries: Secondary | ICD-10-CM | POA: Diagnosis not present

## 2022-09-29 DIAGNOSIS — I2510 Atherosclerotic heart disease of native coronary artery without angina pectoris: Secondary | ICD-10-CM

## 2022-09-29 DIAGNOSIS — I10 Essential (primary) hypertension: Secondary | ICD-10-CM

## 2022-09-29 DIAGNOSIS — I255 Ischemic cardiomyopathy: Secondary | ICD-10-CM

## 2022-09-29 NOTE — Patient Instructions (Signed)
Medication Instructions:  Your physician recommends that you continue on your current medications as directed. Please refer to the Current Medication list given to you today. *If you need a refill on your cardiac medications before your next appointment, please call your pharmacy*  Lab Work: None Ordered  Testing/Procedures: None Ordered  Follow-Up: At [facility], you and your health needs are our priority.  As part of our continuing mission to provide you with exceptional heart care, we have created designated Provider Care Teams.  These Care Teams include your primary Cardiologist (physician) and Advanced Practice Providers (APPs -  Physician Assistants and Nurse Practitioners) who all work together to provide you with the care you need, when you need it.  We recommend signing up for the patient portal called "MyChart".  Sign up information is provided on this After Visit Summary.  MyChart is used to connect with patients for Virtual Visits (Telemedicine).  Patients are able to view lab/test results, encounter notes, upcoming appointments, etc.  Non-urgent messages can be sent to your provider as well.   To learn more about what you can do with MyChart, go to [email or web address].    Your next appointment:   12 month(s)  The format for your next appointment:   In Person  Provider:   Ermalinda Barrios, PA-C       Other Instructions   Important Information About Sugar

## 2022-11-19 DIAGNOSIS — Z6825 Body mass index (BMI) 25.0-25.9, adult: Secondary | ICD-10-CM | POA: Diagnosis not present

## 2022-11-19 DIAGNOSIS — M791 Myalgia, unspecified site: Secondary | ICD-10-CM | POA: Diagnosis not present

## 2023-02-16 DIAGNOSIS — H52203 Unspecified astigmatism, bilateral: Secondary | ICD-10-CM | POA: Diagnosis not present

## 2023-02-16 DIAGNOSIS — H5213 Myopia, bilateral: Secondary | ICD-10-CM | POA: Diagnosis not present

## 2023-02-16 DIAGNOSIS — H524 Presbyopia: Secondary | ICD-10-CM | POA: Diagnosis not present

## 2023-02-16 DIAGNOSIS — H25811 Combined forms of age-related cataract, right eye: Secondary | ICD-10-CM | POA: Diagnosis not present

## 2023-02-17 DIAGNOSIS — Z0100 Encounter for examination of eyes and vision without abnormal findings: Secondary | ICD-10-CM | POA: Diagnosis not present

## 2023-02-17 DIAGNOSIS — H524 Presbyopia: Secondary | ICD-10-CM | POA: Diagnosis not present

## 2023-04-13 DIAGNOSIS — Z6825 Body mass index (BMI) 25.0-25.9, adult: Secondary | ICD-10-CM | POA: Diagnosis not present

## 2023-04-13 DIAGNOSIS — N1831 Chronic kidney disease, stage 3a: Secondary | ICD-10-CM | POA: Diagnosis not present

## 2023-04-13 DIAGNOSIS — I77819 Aortic ectasia, unspecified site: Secondary | ICD-10-CM | POA: Diagnosis not present

## 2023-04-13 DIAGNOSIS — I700 Atherosclerosis of aorta: Secondary | ICD-10-CM | POA: Diagnosis not present

## 2023-04-13 DIAGNOSIS — K921 Melena: Secondary | ICD-10-CM | POA: Diagnosis not present

## 2023-04-27 DIAGNOSIS — E7800 Pure hypercholesterolemia, unspecified: Secondary | ICD-10-CM | POA: Diagnosis not present

## 2023-04-27 DIAGNOSIS — I10 Essential (primary) hypertension: Secondary | ICD-10-CM | POA: Diagnosis not present

## 2023-04-27 DIAGNOSIS — Z08 Encounter for follow-up examination after completed treatment for malignant neoplasm: Secondary | ICD-10-CM | POA: Diagnosis not present

## 2023-04-27 DIAGNOSIS — Z8546 Personal history of malignant neoplasm of prostate: Secondary | ICD-10-CM | POA: Diagnosis not present

## 2023-05-04 DIAGNOSIS — I77819 Aortic ectasia, unspecified site: Secondary | ICD-10-CM | POA: Diagnosis not present

## 2023-05-04 DIAGNOSIS — I700 Atherosclerosis of aorta: Secondary | ICD-10-CM | POA: Diagnosis not present

## 2023-05-04 DIAGNOSIS — Z8546 Personal history of malignant neoplasm of prostate: Secondary | ICD-10-CM | POA: Diagnosis not present

## 2023-05-04 DIAGNOSIS — N529 Male erectile dysfunction, unspecified: Secondary | ICD-10-CM | POA: Diagnosis not present

## 2023-05-04 DIAGNOSIS — I10 Essential (primary) hypertension: Secondary | ICD-10-CM | POA: Diagnosis not present

## 2023-05-04 DIAGNOSIS — Z6825 Body mass index (BMI) 25.0-25.9, adult: Secondary | ICD-10-CM | POA: Diagnosis not present

## 2023-05-04 DIAGNOSIS — E7800 Pure hypercholesterolemia, unspecified: Secondary | ICD-10-CM | POA: Diagnosis not present

## 2023-05-04 DIAGNOSIS — Z0000 Encounter for general adult medical examination without abnormal findings: Secondary | ICD-10-CM | POA: Diagnosis not present

## 2023-05-04 DIAGNOSIS — N1831 Chronic kidney disease, stage 3a: Secondary | ICD-10-CM | POA: Diagnosis not present

## 2023-05-19 DIAGNOSIS — R42 Dizziness and giddiness: Secondary | ICD-10-CM | POA: Diagnosis not present

## 2023-05-19 DIAGNOSIS — I10 Essential (primary) hypertension: Secondary | ICD-10-CM | POA: Diagnosis not present

## 2023-05-19 DIAGNOSIS — I160 Hypertensive urgency: Secondary | ICD-10-CM | POA: Diagnosis not present

## 2023-05-19 DIAGNOSIS — Z6825 Body mass index (BMI) 25.0-25.9, adult: Secondary | ICD-10-CM | POA: Diagnosis not present

## 2023-06-22 DIAGNOSIS — R251 Tremor, unspecified: Secondary | ICD-10-CM | POA: Diagnosis not present

## 2023-06-22 DIAGNOSIS — I10 Essential (primary) hypertension: Secondary | ICD-10-CM | POA: Diagnosis not present

## 2023-06-22 DIAGNOSIS — Z6825 Body mass index (BMI) 25.0-25.9, adult: Secondary | ICD-10-CM | POA: Diagnosis not present

## 2023-06-22 DIAGNOSIS — D696 Thrombocytopenia, unspecified: Secondary | ICD-10-CM | POA: Diagnosis not present

## 2023-08-30 NOTE — Progress Notes (Signed)
Cardiology Office Note:  .   Date:  09/13/2023  ID:  Dillon Middleton, DOB Jun 29, 1936, MRN [id number] PCP: Daisy Floro, MD  [facility] Providers Cardiologist:  Verne Carrow, MD    History of Present Illness: .   Dillon Middleton is a 87 y.o. male   with history of CAD status post anterior STEMI 2013 treated with DES to the mid LAD other vessels free of obstruction, ischemic cardiomyopathy ejection fraction 35 to 40% on echo 2018 no significant valvular disease, hypertension, HLD, mild bilateral carotid disease 2018.   Had abdominal US in June 2021-ectatic aorta. repeat echo and LVEF improved 40-45%.  I saw him last year and he Road 84 miles on his 79 th bday to [place].  Patient comes in for yearly f/u. Still biking 2 days a week, usually by himself.Belongs to 4 different gyms and lifts weights.  No chest pain, dyspnea, palpitations, edema, dizziness. 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 Had some dizziness and low BP on losartan so changed to olmesartan.   ROS:    Studies Reviewed: Marland Kitchen    EKG Interpretation Date/Time:  Tuesday September 13 2023 09:40:26 EST Ventricular Rate:  72 PR Interval:  168 QRS Duration:  174 QT Interval:  440 QTC Calculation: 481 R Axis:   263  Text Interpretation: Normal sinus rhythm with sinus arrhythmia Right bundle branch block Anterolateral infarct , age undetermined When compared with ECG of 26-May-2019 23:27, PREVIOUS ECG IS PRESENT Confirmed by Jacolyn Reedy [phone]) on 09/13/2023 9:41:55 AM    Prior CV Studies:   Echo 10/16/21 IMPRESSIONS     1. Hypokinesis of the mid-apical inferoseptal, apical septal, apical  anterior, apical, and apical anterolateral myocardium. Left ventricular  ejection fraction, by estimation, is 40 to 45%. The left ventricle has  mildly decreased function. The left  ventricle demonstrates regional wall motion abnormalities (see scoring   diagram/findings for description). There is mild concentric left  ventricular hypertrophy. Left ventricular diastolic parameters are  consistent with Grade I diastolic dysfunction  (impaired relaxation).   2. Right ventricular systolic function is normal. The right ventricular  size is normal. There is normal pulmonary artery systolic pressure.   3. Left atrial size was moderately dilated.   4. The mitral valve is normal in structure. Trivial mitral valve  regurgitation. No evidence of mitral stenosis.   5. The aortic valve is tricuspid. There is mild calcification of the  aortic valve. There is mild thickening of the aortic valve. Aortic valve  regurgitation is mild. Aortic valve sclerosis is present, with no evidence  of aortic valve stenosis. Aortic  regurgitation PHT measures 606 msec.   6. Aortic dilatation noted. There is borderline dilatation of the aortic  root, measuring 36 mm. There is mild dilatation of the ascending aorta,  measuring 39 mm.   7. The inferior vena cava is normal in size with greater than 50%  respiratory variability, suggesting right atrial pressure of 3 mmHg.   Comparison(s): Compared with the echo 08/2017, no significant changes.   FINDINGS   Left Ventricle: Hypokinesis of the mid-apical inferoseptal, apical  septal, apical anterior, apical, and apical anterolateral myocardium. Left  ventricular ejection fraction, by estimation, is 40 to 45%. The left  ventricle has mildly decreased function.  The left ventricle demonstrates regional wall motion abnormalities. The  left ventricular internal cavity size was normal in size. There is mild  concentric left ventricular  hypertrophy. Left ventricular diastolic  parameters are consistent with Grade I  diastolic dysfunction (impaired relaxation). Normal left ventricular  filling pressure.   Right Ventricle: ThCarotids 10/08/20 Summary:  Right Carotid: Velocities in the right ICA are consistent with a stable   1-39%                stenosis. Non-hemodynamically significant plaque <50% noted  in                the CCA.   Left Carotid: Velocities in the left ICA are consistent with a stable  1-39%               stenosis. Non-hemodynamically significant plaque <50% noted  in the                CCA.   Vertebrals:  Bilateral vertebral arteries demonstrate antegrade flow.  Subclavians: Normal flow hemodynamics were seen in bilateral subclavian               arteries.   *See table(s) above for measurements and observations.         Risk Assessment/Calculations:             Physical Exam:   VS:  BP 138/80   Pulse 76   Ht 5\' 11"  (1.803 m)   Wt 176 lb 3.2 oz (79.9 kg)   SpO2 96%   BMI 24.57 kg/m    Wt Readings from Last 3 Encounters:  09/13/23 176 lb 3.2 oz (79.9 kg)  09/29/22 174 lb 12.8 oz (79.3 kg)  09/29/21 175 lb 3.2 oz (79.5 kg)    GEN: Well nourished, well developed in no acute distress NECK: No JVD; No carotid bruits CARDIAC:  RRR,  S4, 3/6 diastolic murmur LSB 1/6 systolic murmur apex. RESPIRATORY:  Clear to auscultation without rales, wheezing or rhonchi  ABDOMEN: Soft, non-tender, non-distended EXTREMITIES:  No edema; No deformity   ASSESSMENT AND PLAN: .   CAD status post anterior STEMI 2013 treated with DES to the mid LAD other vessels free of obstruction. No angina, exercises everyday. Continue ASA, lipitor, coreg, olmesartan   Ischemic cardiomyopathy ejection fraction improved 40-45% on echo in 2022 -no CHF symptoms  Aortic insufficiency on echo 2022, significant murmur on exam but no symptoms. Update echo.   Essential hypertension BP controlled   Hyperlipidemia-LDL 67 04/2023 on lipitor   Mild bilateral carotid disease on Dopplers in 2021 1-39% bilateral ICA. due for recheck.         Dispo: f/u in 51yr.  Signed, Jacolyn Reedy, PA-C

## 2023-09-13 ENCOUNTER — Encounter: Payer: Self-pay | Admitting: Physician Assistant

## 2023-09-13 ENCOUNTER — Ambulatory Visit: Payer: Medicare HMO | Attending: Physician Assistant | Admitting: Physician Assistant

## 2023-09-13 VITALS — BP 138/80 | HR 76 | Ht 71.0 in | Wt 176.2 lb

## 2023-09-13 DIAGNOSIS — I2510 Atherosclerotic heart disease of native coronary artery without angina pectoris: Secondary | ICD-10-CM | POA: Diagnosis not present

## 2023-09-13 DIAGNOSIS — I6523 Occlusion and stenosis of bilateral carotid arteries: Secondary | ICD-10-CM | POA: Diagnosis not present

## 2023-09-13 DIAGNOSIS — E785 Hyperlipidemia, unspecified: Secondary | ICD-10-CM

## 2023-09-13 DIAGNOSIS — I351 Nonrheumatic aortic (valve) insufficiency: Secondary | ICD-10-CM | POA: Diagnosis not present

## 2023-09-13 DIAGNOSIS — I10 Essential (primary) hypertension: Secondary | ICD-10-CM

## 2023-09-13 DIAGNOSIS — I255 Ischemic cardiomyopathy: Secondary | ICD-10-CM | POA: Diagnosis not present

## 2023-09-13 NOTE — Patient Instructions (Signed)
Medication Instructions:  Your physician recommends that you continue on your current medications as directed. Please refer to the Current Medication list given to you today.  *If you need a refill on your cardiac medications before your next appointment, please call your pharmacy*  Lab Work: None ordered today.  Testing/Procedures: Your physician has requested that you have a bilateral carotid duplex. This test is an ultrasound of the carotid arteries in your neck. It looks at blood flow through these arteries that supply the brain with blood. Allow one hour for this exam. There are no restrictions or special instructions.   Your physician has requested that you have an echocardiogram. Echocardiography is a painless test that uses sound waves to create images of your heart. It provides your doctor with information about the size and shape of your heart and how well your heart's chambers and valves are working. This procedure takes approximately one hour. There are no restrictions for this procedure. Please do NOT wear cologne, perfume, aftershave, or lotions (deodorant is allowed). Please arrive 15 minutes prior to your appointment time.  Please note: We ask at that you not bring children with you during ultrasound (echo/ vascular) testing. Due to room size and safety concerns, children are not allowed in the ultrasound rooms during exams. Our front office staff cannot provide observation of children in our lobby area while testing is being conducted. An adult accompanying a patient to their appointment will only be allowed in the ultrasound room at the discretion of the ultrasound technician under special circumstances. We apologize for any inconvenience.  Follow-Up: At [facility], you and your health needs are our priority.  As part of our continuing mission to provide you with exceptional heart care, we have created designated Provider Care Teams.  These Care Teams include your primary  Cardiologist (physician) and Advanced Practice Providers (APPs -  Physician Assistants and Nurse Practitioners) who all work together to provide you with the care you need, when you need it.  We recommend signing up for the patient portal called "MyChart".  Sign up information is provided on this After Visit Summary.  MyChart is used to connect with patients for Virtual Visits (Telemedicine).  Patients are able to view lab/test results, encounter notes, upcoming appointments, etc.  Non-urgent messages can be sent to your provider as well.   To learn more about what you can do with MyChart, go to [email or web address].    Your next appointment:   1 year(s)  The format for your next appointment:   In Person  Provider:   Verne Carrow, MD

## 2023-10-03 ENCOUNTER — Ambulatory Visit (HOSPITAL_COMMUNITY)
Admission: RE | Admit: 2023-10-03 | Discharge: 2023-10-03 | Disposition: A | Discharge status: Home / Self Care | Payer: Medicare HMO | Source: Ambulatory Visit | Attending: Cardiology | Admitting: Cardiology

## 2023-10-03 DIAGNOSIS — I6523 Occlusion and stenosis of bilateral carotid arteries: Secondary | ICD-10-CM | POA: Diagnosis not present

## 2023-10-17 ENCOUNTER — Ambulatory Visit (HOSPITAL_COMMUNITY): Payer: Medicare HMO | Attending: Physician Assistant

## 2023-10-17 DIAGNOSIS — I351 Nonrheumatic aortic (valve) insufficiency: Secondary | ICD-10-CM | POA: Diagnosis not present

## 2023-10-17 LAB — ECHOCARDIOGRAM COMPLETE
Area-P 1/2: 3.68 cm2
P 1/2 time: 583 ms
S' Lateral: 4 cm

## 2023-10-17 MED ORDER — PERFLUTREN LIPID MICROSPHERE
1.0000 mL | INTRAVENOUS | Status: AC | PRN
  Administered 2023-10-17: 2 mL via INTRAVENOUS

## 2023-10-20 ENCOUNTER — Telehealth: Payer: Self-pay | Admitting: *Deleted

## 2023-10-20 NOTE — Telephone Encounter (Addendum)
-----   Message from Verne Carrow sent at 10/18/2023 11:39 AM EST ----- [name], I think his LVEF is about the same. He had an anterior MI years ago. 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 Dillon Middleton  October 20, 2023 Collier Bullock to Me     10/20/23  9:06 AM Result Note Palak Tercero were you able to get in touch with this patient to start Eliquis and get labs etc? Also he should stop his ASA on eliquis. thanks October 18, 2023 Collier Bullock to [facility]     10/18/23  2:27 PM Dillon Middleton, can you let patient know that echo shows heart function about the same but it shows thrombus and Dr. Clifton James reviewed and would like him to start on Eliquis 5mg  bid. He'll need to have an updated CBC & BMET. Recheck echo in 3 months. Thanks, Dillon Middleton __________________________________________________________   Left message for patient to call back. Left message for patient's niece, Dillon Middleton [facility]) to call back.

## 2023-10-27 ENCOUNTER — Telehealth: Payer: Self-pay

## 2023-10-27 DIAGNOSIS — I2510 Atherosclerotic heart disease of native coronary artery without angina pectoris: Secondary | ICD-10-CM

## 2023-10-27 NOTE — Telephone Encounter (Addendum)
Spoke with patient about recent echocardiogram. Patient agrees to start eliquis, will forward to PharmD. No recent labs, patient will come to our office today to have BMET and CBC drawn prior to start. Patient also made aware to stop taking aspirin at start of eliquis. No questions at this time     Dyann Kief, Cordelia Poche 10/20/2023  9:06 AM EST     Michalene were you able to get in touch with this patient to start Eliquis and get labs etc? Also he should stop his ASA on eliquis. thanks   Kathleene Hazel, MD 10/18/2023 11:39 AM EST     [name], I think his LVEF is about the same. He had an anterior MI years ago. 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 Thayer Ohm

## 2023-10-28 DIAGNOSIS — I2510 Atherosclerotic heart disease of native coronary artery without angina pectoris: Secondary | ICD-10-CM | POA: Diagnosis not present

## 2023-10-29 LAB — BASIC METABOLIC PANEL
BUN/Creatinine Ratio: 15 (ref 10–24)
BUN: 17 mg/dL (ref 8–27)
CO2: 25 mmol/L (ref 20–29)
Calcium: 9.3 mg/dL (ref 8.6–10.2)
Chloride: 105 mmol/L (ref 96–106)
Creatinine, Ser: 1.17 mg/dL (ref 0.76–1.27)
Glucose: 84 mg/dL (ref 70–99)
Potassium: 4.3 mmol/L (ref 3.5–5.2)
Sodium: 142 mmol/L (ref 134–144)
eGFR: 61 mL/min/{1.73_m2} (ref >59)

## 2023-10-29 LAB — CBC
Hematocrit: 42.8 % (ref 37.5–51.0)
Hemoglobin: 13.9 g/dL (ref 13.0–17.7)
MCH: 30.1 pg (ref 26.6–33.0)
MCHC: 32.5 g/dL (ref 31.5–35.7)
MCV: 93 fL (ref 79–97)
Platelets: 101 10*3/uL — ABNORMAL LOW (ref 150–450)
RBC: 4.62 x10E6/uL (ref 4.14–5.80)
RDW: 12.7 % (ref 11.6–15.4)
WBC: 3.4 10*3/uL (ref 3.4–10.8)

## 2023-10-31 ENCOUNTER — Telehealth: Payer: Self-pay

## 2023-10-31 ENCOUNTER — Other Ambulatory Visit: Payer: Self-pay | Admitting: *Deleted

## 2023-10-31 MED ORDER — APIXABAN 5 MG PO TABS: 5.0000 mg | ORAL_TABLET | Freq: Two times a day (BID) | ORAL | 11 refills | Status: DC

## 2023-10-31 NOTE — Telephone Encounter (Signed)
Called and spoke with patient who verbalized understanding. Aspirin removed from medication list at this time. Eliquis 5 sent to pharmacy. All questions answered.

## 2023-10-31 NOTE — Telephone Encounter (Signed)
-----   Message from Cheree Ditto sent at 10/31/2023  9:55 AM EST ----- Yes, Eliquis 5mg  twice daily ----- Message ----- From: Lars Mage, RN Sent: 10/31/2023   9:30 AM EST To: Cv Div Pharmd  Will you guys verify, now that labs have been updated, should his eliquis dose be 5mg  twice daily? ----- Message ----- From: Kathleene Hazel, MD Sent: 10/18/2023  11:39 AM EST To: Dyann Kief, PA-C; Lendon Ka, RN  [name], I think his LVEF is about the same. He had an anterior MI years ago. 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 Thayer Ohm

## 2023-10-31 NOTE — Telephone Encounter (Signed)
Duplicate encounters-closing this one. Please see 10/27/23 phone note for completion.

## 2023-11-03 DIAGNOSIS — H60502 Unspecified acute noninfective otitis externa, left ear: Secondary | ICD-10-CM | POA: Diagnosis not present

## 2024-02-10 ENCOUNTER — Telehealth: Payer: Self-pay | Admitting: Cardiovascular Disease

## 2024-02-10 ENCOUNTER — Telehealth: Payer: Self-pay

## 2024-02-10 NOTE — Telephone Encounter (Signed)
 Patient came in to ask about his Elequis medication. He mentioned a nurse that normally stops by to check on him asked why he was taking it and he did not know. He wanted clarification. I reached out to triage and someone would be reaching out to speak with him.

## 2024-02-10 NOTE — Telephone Encounter (Signed)
 Left voicemail to return call to office

## 2024-03-29 DIAGNOSIS — I10 Essential (primary) hypertension: Secondary | ICD-10-CM | POA: Diagnosis not present

## 2024-03-29 DIAGNOSIS — N1831 Chronic kidney disease, stage 3a: Secondary | ICD-10-CM | POA: Diagnosis not present

## 2024-04-27 DIAGNOSIS — N1831 Chronic kidney disease, stage 3a: Secondary | ICD-10-CM | POA: Diagnosis not present

## 2024-04-27 DIAGNOSIS — I10 Essential (primary) hypertension: Secondary | ICD-10-CM | POA: Diagnosis not present

## 2024-04-30 DIAGNOSIS — E7800 Pure hypercholesterolemia, unspecified: Secondary | ICD-10-CM | POA: Diagnosis not present

## 2024-04-30 DIAGNOSIS — N1831 Chronic kidney disease, stage 3a: Secondary | ICD-10-CM | POA: Diagnosis not present

## 2024-04-30 DIAGNOSIS — I10 Essential (primary) hypertension: Secondary | ICD-10-CM | POA: Diagnosis not present

## 2024-05-09 ENCOUNTER — Telehealth: Payer: Self-pay | Admitting: Cardiovascular Disease

## 2024-05-09 DIAGNOSIS — D696 Thrombocytopenia, unspecified: Secondary | ICD-10-CM | POA: Diagnosis not present

## 2024-05-09 DIAGNOSIS — I10 Essential (primary) hypertension: Secondary | ICD-10-CM | POA: Diagnosis not present

## 2024-05-09 DIAGNOSIS — Z0000 Encounter for general adult medical examination without abnormal findings: Secondary | ICD-10-CM | POA: Diagnosis not present

## 2024-05-09 DIAGNOSIS — N1831 Chronic kidney disease, stage 3a: Secondary | ICD-10-CM | POA: Diagnosis not present

## 2024-05-09 DIAGNOSIS — E7800 Pure hypercholesterolemia, unspecified: Secondary | ICD-10-CM | POA: Diagnosis not present

## 2024-05-09 DIAGNOSIS — Z08 Encounter for follow-up examination after completed treatment for malignant neoplasm: Secondary | ICD-10-CM | POA: Diagnosis not present

## 2024-05-09 NOTE — Telephone Encounter (Signed)
 Received a STAT referral from PCP stating: STAT FAXED 05/09/24 Dr Verlin or Olivia Fryer put Mr Dillon Middleton on Eliquis  and we can not figure out why- nothing in the notes that I received explains it. Please review and decide if he needs to be on it and if so explain it to him and send me a memo or note , Thanks much!  Please advise.

## 2024-05-09 NOTE — Telephone Encounter (Signed)
 Received message from PCP office they they did not have anything in their records as to indication for patient to be on eliquis . Review of records show patient was started on eliquis  in December 2024 for likely LV apical thrombus. Forwarded copy of echo and recommendations to PCP office.

## 2024-05-10 NOTE — Telephone Encounter (Signed)
 Pt called back to inquire as to why he was started on Eliquis .  Educated pt to apical  thrombus on December 2024 echocardiogram.  Aware it may be time for follow up testing to see if thrombus remains.  Forwarding to MD for advisement. (Dr. Verlin - pt follows up with you in November) Pt agreeable to plan

## 2024-05-11 NOTE — Telephone Encounter (Signed)
 Left message for patient to call back and then sent him myChart message of Dr. Santa recommendation.

## 2024-05-27 DIAGNOSIS — I10 Essential (primary) hypertension: Secondary | ICD-10-CM | POA: Diagnosis not present

## 2024-05-27 DIAGNOSIS — N1831 Chronic kidney disease, stage 3a: Secondary | ICD-10-CM | POA: Diagnosis not present

## 2024-05-31 DIAGNOSIS — N1831 Chronic kidney disease, stage 3a: Secondary | ICD-10-CM | POA: Diagnosis not present

## 2024-05-31 DIAGNOSIS — I10 Essential (primary) hypertension: Secondary | ICD-10-CM | POA: Diagnosis not present

## 2024-05-31 DIAGNOSIS — E7800 Pure hypercholesterolemia, unspecified: Secondary | ICD-10-CM | POA: Diagnosis not present

## 2024-06-26 DIAGNOSIS — I10 Essential (primary) hypertension: Secondary | ICD-10-CM | POA: Diagnosis not present

## 2024-06-26 DIAGNOSIS — N1831 Chronic kidney disease, stage 3a: Secondary | ICD-10-CM | POA: Diagnosis not present

## 2024-07-01 DIAGNOSIS — I10 Essential (primary) hypertension: Secondary | ICD-10-CM | POA: Diagnosis not present

## 2024-07-01 DIAGNOSIS — N1831 Chronic kidney disease, stage 3a: Secondary | ICD-10-CM | POA: Diagnosis not present

## 2024-07-01 DIAGNOSIS — E7800 Pure hypercholesterolemia, unspecified: Secondary | ICD-10-CM | POA: Diagnosis not present

## 2024-07-31 DIAGNOSIS — I10 Essential (primary) hypertension: Secondary | ICD-10-CM | POA: Diagnosis not present

## 2024-07-31 DIAGNOSIS — N1831 Chronic kidney disease, stage 3a: Secondary | ICD-10-CM | POA: Diagnosis not present

## 2024-07-31 DIAGNOSIS — E7800 Pure hypercholesterolemia, unspecified: Secondary | ICD-10-CM | POA: Diagnosis not present

## 2024-09-17 ENCOUNTER — Ambulatory Visit: Payer: Self-pay | Attending: Cardiovascular Disease | Admitting: Cardiovascular Disease

## 2024-09-17 ENCOUNTER — Encounter: Payer: Self-pay | Admitting: Cardiovascular Disease

## 2024-09-17 VITALS — BP 112/72 | HR 66 | Ht 71.0 in | Wt 172.2 lb

## 2024-09-17 DIAGNOSIS — I10 Essential (primary) hypertension: Secondary | ICD-10-CM

## 2024-09-17 DIAGNOSIS — I6523 Occlusion and stenosis of bilateral carotid arteries: Secondary | ICD-10-CM

## 2024-09-17 DIAGNOSIS — I255 Ischemic cardiomyopathy: Secondary | ICD-10-CM | POA: Diagnosis not present

## 2024-09-17 DIAGNOSIS — I2510 Atherosclerotic heart disease of native coronary artery without angina pectoris: Secondary | ICD-10-CM | POA: Diagnosis not present

## 2024-09-17 DIAGNOSIS — I513 Intracardiac thrombosis, not elsewhere classified: Secondary | ICD-10-CM

## 2024-09-17 MED ORDER — NITROGLYCERIN 0.4 MG SL SUBL: 0.4000 mg | SUBLINGUAL_TABLET | SUBLINGUAL | 3 refills | Status: AC | PRN

## 2024-09-17 NOTE — Patient Instructions (Signed)

## 2024-09-17 NOTE — Progress Notes (Signed)
 Chief Complaint  Patient presents with   Follow-up    CAD   History of Present Illness: 88 yo male with history of HTN, HLD, CAD, ischemic cardiomyopathy and LV thrombus who is here today for cardiac follow up. He was admitted to [facility] in April 2013 with an anterior STEMI. Cardiac cath with occluded mid LAD that was treated with a drug eluting stent. His other vessels were free of obstructive disease. LV systolic dysfunction noted post MI. Echo May 2013 with LVEF=40%. Most recent echo December 2024 with LVEF=35-40% with small LV apical thrombus. He was started on Eliquis .   He is here today for follow up. The patient denies any chest pain, dyspnea, palpitations, lower extremity edema, orthopnea, PND, dizziness, near syncope or syncope.   Primary Care Physician: Okey Carlin Redbird, MD  Past Medical History:  Diagnosis Date   CAD (coronary artery disease)    a. 01/2012 anterior STEMI PTCA/DES x 1 mid LAD   Hyperlipidemia    Hypertension    Ischemic cardiomyopathy    a. 02/06/12 Echo: EF 30-35% s/p LifeVest placement;   b. 03/21/2012 Echo: EF 35-40%, antlat, infsept HK, dist antersept and lat AK, Gr1 DD, lifevest was d/c'd    Past Surgical History:  Procedure Laterality Date   LEFT HEART CATH Left 02/05/2012   Procedure: LEFT HEART CATH;  Surgeon: Lonni JONETTA Cash, MD;  Location: [facility] CATH LAB;  Service: Cardiovascular;  Laterality: Left;   PERCUTANEOUS CORONARY STENT INTERVENTION (PCI-S) Right 02/05/2012   Procedure: PERCUTANEOUS CORONARY STENT INTERVENTION (PCI-S);  Surgeon: Lonni JONETTA Cash, MD;  Location: [facility] CATH LAB;  Service: Cardiovascular;  Laterality: Right;    Current Outpatient Medications  Medication Sig Dispense Refill   amLODipine  (NORVASC ) 5 MG tablet TAKE 1 TABLET EVERY DAY 90 tablet 3   apixaban  (ELIQUIS ) 5 MG TABS tablet Take 1 tablet (5 mg total) by mouth 2 (two) times daily. 60 tablet 11   atorvastatin  (LIPITOR ) 80 MG tablet Take 1 tablet (80 mg total) by  mouth daily. 90 tablet 3   atovaquone -proguanil (MALARONE ) 250-100 MG TABS tablet Take 1 tablet by mouth daily. Start on 11/5, take daily until complete 30 tablet 0   carvedilol  (COREG ) 6.25 MG tablet Take 1 tablet (6.25 mg total) by mouth 2 (two) times daily. 180 tablet 3   olmesartan (BENICAR) 20 MG tablet Take 20 mg by mouth daily.     predniSONE  (DELTASONE ) 20 MG tablet 2 tabs po daily x 4 days 8 tablet 0   azithromycin  (ZITHROMAX ) 500 MG tablet Take 1 tablet (500 mg total) by mouth daily. If you have 3+ loose stools/24 hr. Can stop taking if diarrhea stops (Patient not taking: Reported on 09/17/2024) 5 tablet 0   ciprofloxacin  (CIPRO ) 500 MG tablet Take 1 tablet (500 mg total) by mouth 2 (two) times daily. Take 1-2 days until diarrhea resolves (Patient not taking: Reported on 09/17/2024) 6 tablet 0   nitroGLYCERIN  (NITROSTAT ) 0.4 MG SL tablet Place 1 tablet (0.4 mg total) under the tongue every 5 (five) minutes x 3 doses as needed for chest pain. 25 tablet 3   No current facility-administered medications for this visit.    Allergies  Allergen Reactions   Lisinopril  Cough    Social History   Socioeconomic History   Marital status: Divorced    Spouse name: Not on file   Number of children: Not on file   Years of education: Not on file   Highest education level: Not on file  Occupational History   Not on file  Tobacco Use   Smoking status: Former   Smokeless tobacco: Never  Vaping Use   Vaping status: Never Used  Substance and Sexual Activity   Alcohol use: No   Drug use: No   Sexual activity: Yes    Birth control/protection: None  Other Topics Concern   Not on file  Social History Narrative   Not on file   Social Drivers of Health   Financial Resource Strain: Not on file  Food Insecurity: Not on file  Transportation Needs: Not on file  Physical Activity: Not on file  Stress: Not on file  Social Connections: Not on file  Intimate Partner Violence: Not on file     Family History  Problem Relation Age of Onset   Dementia Mother    Stroke Father    Hypertension Sister     Review of Systems:  As stated in the HPI and otherwise negative.   BP 112/72   Pulse 66   Ht 5' 11 (1.803 m)   Wt 172 lb 3.2 oz (78.1 kg)   SpO2 99%   BMI 24.02 kg/m   Physical Examination: General: Well developed, well nourished, NAD  HEENT: OP clear, mucus membranes moist  SKIN: warm, dry. No rashes. Neuro: No focal deficits  Musculoskeletal: Muscle strength 5/5 all ext  Psychiatric: Mood and affect normal  Neck: No JVD, no carotid bruits, no thyromegaly, no lymphadenopathy.  Lungs:Clear bilaterally, no wheezes, rhonci, crackles Cardiovascular: Regular rate and rhythm. No murmurs, gallops or rubs. Abdomen:Soft. Bowel sounds present. Non-tender.  Extremities: No lower extremity edema. Pulses are 2 + in the bilateral DP/PT.  EKG:  EKG is ordered today. The ekg ordered today demonstrates   EKG Interpretation Date/Time:  Monday September 17 2024 09:54:19 EST Ventricular Rate:  66 PR Interval:  146 QRS Duration:  178 QT Interval:  440 QTC Calculation: 461 R Axis:   266  Text Interpretation: Sinus rhythm with Premature supraventricular complexes and with occasional Premature ventricular complexes Right bundle branch block When compared with ECG of 13-Sep-2023 09:40, Confirmed by Verlin Bruckner [phone]) on 09/17/2024 10:07:10 AM    Recent Labs: 10/28/2023: BUN 17; Creatinine, Ser 1.17; Hemoglobin 13.9; Platelets 101; Potassium 4.3; Sodium 142   Lipid Panel    Component Value Date/Time   CHOL 142 10/09/2020 1034   TRIG 29 10/09/2020 1034   HDL 70 10/09/2020 1034   CHOLHDL 2.0 10/09/2020 1034   CHOLHDL 2 01/31/2013 0736   VLDL 5.8 01/31/2013 0736   LDLCALC 64 10/09/2020 1034     Wt Readings from Last 3 Encounters:  09/17/24 172 lb 3.2 oz (78.1 kg)  09/13/23 176 lb 3.2 oz (79.9 kg)  09/29/22 174 lb 12.8 oz (79.3 kg)    Assessment and Plan:    1. CAD without angina: No chest pain. Continue statin and beta blocker. He is no longer on ASA since he is on Eliquis . .   2. Ischemic cardiomyopathy: He is known to have LV systolic dysfunction following his MI. Echo December 2024 with LVEF=35-40%. Continue Coreg  and Benicar.   3. LV thrombus. Continue Eliquis  for lifetime given his LV apical wall motion abnormality and potential for thrombus formation.   4. HTN: BP is well controlled. Continue current medications  5. Carotid artery disease: Last carotid dopplers December 2024 with mild bilateral carotid disease.   Labs/ tests ordered today include:   Orders Placed This Encounter  Procedures   EKG 12-Lead   Disposition:  F/U with me in 12  months  Signed, Lonni Cash, MD 09/17/2024 12:21 PM    [facility] [address] [place], [place], [place]  [address] Phone: [phone]; Fax: [phone]

## 2024-11-15 ENCOUNTER — Other Ambulatory Visit: Payer: Self-pay | Admitting: Cardiovascular Disease
